# Patient Record
Sex: Female | Born: 1990 | Race: White | Hispanic: No | Marital: Single | Smoking: Former smoker
Health system: Southern US, Community
[De-identification: ages and names within clinical notes are randomized; demographics above are authoritative.]

## PROBLEM LIST (undated history)

## (undated) DIAGNOSIS — O139 Gestational [pregnancy-induced] hypertension without significant proteinuria, unspecified trimester: Secondary | ICD-10-CM

## (undated) DIAGNOSIS — Z789 Other specified health status: Secondary | ICD-10-CM

## (undated) HISTORY — PX: NO PAST SURGERIES: SHX2092

---
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
## 2009-11-16 ENCOUNTER — Inpatient Hospital Stay (HOSPITAL_COMMUNITY): Admission: AD | Admit: 2009-11-16 | Discharge: 2009-11-19 | Payer: Self-pay | Admitting: Obstetrics & Gynecology

## 2009-11-17 ENCOUNTER — Encounter (INDEPENDENT_AMBULATORY_CARE_PROVIDER_SITE_OTHER): Payer: Self-pay | Admitting: Obstetrics and Gynecology

## 2010-05-12 LAB — CBC
MCHC: 34.5 g/dL (ref 30.0–36.0)
Platelets: 149 10*3/uL — ABNORMAL LOW (ref 150–400)
Platelets: 181 10*3/uL (ref 150–400)
RDW: 13.7 % (ref 11.5–15.5)
RDW: 13.8 % (ref 11.5–15.5)
WBC: 10.2 10*3/uL (ref 4.0–10.5)

## 2010-05-12 LAB — RPR: RPR Ser Ql: NONREACTIVE

## 2011-07-05 ENCOUNTER — Emergency Department (HOSPITAL_COMMUNITY)
Admission: EM | Admit: 2011-07-05 | Discharge: 2011-07-05 | Disposition: A | Discharge status: Home / Self Care | Payer: Self-pay | Attending: Emergency Medicine | Admitting: Emergency Medicine

## 2011-07-05 ENCOUNTER — Encounter (HOSPITAL_COMMUNITY): Payer: Self-pay | Admitting: Emergency Medicine

## 2011-07-05 DIAGNOSIS — R10816 Epigastric abdominal tenderness: Secondary | ICD-10-CM | POA: Insufficient documentation

## 2011-07-05 DIAGNOSIS — K279 Peptic ulcer, site unspecified, unspecified as acute or chronic, without hemorrhage or perforation: Secondary | ICD-10-CM | POA: Insufficient documentation

## 2011-07-05 LAB — URINALYSIS, ROUTINE W REFLEX MICROSCOPIC
Bilirubin Urine: NEGATIVE
Nitrite: NEGATIVE
Specific Gravity, Urine: 1.01 (ref 1.005–1.030)
Urobilinogen, UA: 0.2 mg/dL (ref 0.0–1.0)

## 2011-07-05 LAB — COMPREHENSIVE METABOLIC PANEL
Albumin: 3.7 g/dL (ref 3.5–5.2)
Alkaline Phosphatase: 44 U/L (ref 39–117)
BUN: 8 mg/dL (ref 6–23)
Potassium: 4.2 mEq/L (ref 3.5–5.1)
Total Protein: 6.7 g/dL (ref 6.0–8.3)

## 2011-07-05 LAB — CBC
MCV: 97 fL (ref 78.0–100.0)
Platelets: 174 10*3/uL (ref 150–400)
RDW: 12.7 % (ref 11.5–15.5)
WBC: 7.6 10*3/uL (ref 4.0–10.5)

## 2011-07-05 LAB — OCCULT BLOOD, POC DEVICE: Fecal Occult Bld: NEGATIVE

## 2011-07-05 LAB — PREGNANCY, URINE: Preg Test, Ur: NEGATIVE

## 2011-07-05 MED ORDER — LANSOPRAZOLE 15 MG PO CPDR: 15.0000 mg | DELAYED_RELEASE_CAPSULE | Freq: Every day | ORAL | Status: DC

## 2011-07-05 MED ORDER — HYDROCODONE-ACETAMINOPHEN 5-325 MG PO TABS: 1.0000 | ORAL_TABLET | Freq: Four times a day (QID) | ORAL | Status: AC | PRN

## 2011-07-05 MED ORDER — ONDANSETRON HCL 4 MG PO TABS: 4.0000 mg | ORAL_TABLET | Freq: Four times a day (QID) | ORAL | Status: AC

## 2011-07-05 MED ORDER — GI COCKTAIL ~~LOC~~
30.0000 mL | Freq: Once | ORAL | Status: AC
  Administered 2011-07-05: 30 mL via ORAL
  Filled 2011-07-05: qty 30

## 2011-07-05 NOTE — ED Provider Notes (Signed)
Medical screening examination/treatment/procedure(s) were performed by non-physician practitioner and as supervising physician I was immediately available for consultation/collaboration.  Doug Sou, MD 07/05/11 [phone]

## 2011-07-05 NOTE — ED Notes (Signed)
Epigastric pain started Saturday, with nausea. Had 2 neg pregnancy tests. No vomiting, but having diarrhea- black coffee ground looking per patient

## 2011-07-05 NOTE — ED Notes (Signed)
AVW:[id number]> Expected date:<BR> Expected time:<BR> Means of arrival:<BR> Comments:<BR> Perrot

## 2011-07-05 NOTE — Discharge Instructions (Signed)
Peptic Ulcers Ulcers are small, open craters or sores that develop in the lining of the stomach or the duodenum (the first part of the small intestine). The term peptic ulcer is used to describe both types of ulcers. There are a number of treatments that relieve the discomfort of ulcers. In most cases ulcers do heal.  CAUSES AND COMMON FEATURES OF PEPTIC ULCERS  Peptic ulcers occur only in areas of the digestive system that come in contact with digestive juices. These juices are secreted (given off) by the stomach. They include acid and an enzyme called pepsin that breaks down proteins. Many people with duodenal ulcers have too much digestive juice spilling down from the stomach. Most people with gastric (stomach) ulcers have normal or below normal amounts of stomach acid. Sometimes, when the mucous membrane (protective lining) of the stomach and duodenum does not protect well, this may add to the growth of ulcers. Duodenal ulcers often produces pain in a small area between the breastbone and navel. Pain varies from hunger pain to constant gnawing or burning sensations (feeling). Sometimes the pain is felt during sleep and may awaken the person in the middle of the night. Often the pain occurs two or three hours after eating, when the stomach is empty. Other common symptoms (problems) include overeating for pain relief. Eating relieves the pain of a duodenal ulcer. Gastric ulcer pain may be felt in the same place as the pain of a duodenal ulcer, or slightly higher up. There may also be sensations of feeling full, indigestion, and heartburn. Sometimes pain occurs when the stomach is full. This causes loss of appetite followed by weight loss. HOME CARE INSTRUCTIONS   Use of tobacco products have been found to slow down the healing of an ulcer. STOP SMOKING.   Avoid alcohol, aspirin, and other inflammation (swelling and soreness) reducing drugs. These substances weaken the stomach lining.   Eat regular,  nutritious meals.   Avoid foods that bother you.   Take medications and antacids as directed. Over-the-counter medications are used to neutralize stomach acid. Prescription medications reduce acid secretion, block acid production, or provide a protective coating over the ulcer. If a specific antacid was prescribed, do not switch brands without your caregiver's approval.  Surgery is usually not necessary. Diet and/or drug therapy usually is effective. Surgery may be necessary if perforation, obstruction due to scarring, or uncontrollable bleeding is found, or if severe pain is not otherwise controlled. SEEK IMMEDIATE MEDICAL CARE IF:  You see signs of bleeding. This includes vomiting fresh, bright red blood or passing bloody or tarry, black stools.   You suffer weakness, fatigue, or loss of consciousness. These symptoms can result from severe hemorrhaging (bleeding). Shock may result.   You have sudden, intense, severe abdominal (belly) pain. This is the first sign of a perforation. This would require immediate surgical treatment.   You have intense pain and continued vomiting. This could signal an obstruction of the digestive tract.  Document Released: 02/11/2000 Document Revised: 02/02/2011 Document Reviewed: 02/10/2008 ExitCare Patient Information 2012 [employer]. 

## 2011-07-05 NOTE — ED Provider Notes (Signed)
History     CSN: [id number]  Arrival date & time 07/05/11  4098   First MD Initiated Contact with Patient 07/05/11 1010      Chief Complaint  Patient presents with  . Abdominal Pain  . Gastrophageal Reflux    (Consider location/radiation/quality/duration/timing/severity/associated sxs/prior treatment) HPI  Patient presents to the ED with complaints of epigastric abdominal pain. The patient states that the pains last about 30 seconds and are "sharp and twisting pains" that happened every 3-4 minutes. Eating does not make it better or worse. She has had some nausea associated with this but no vomiting or diarrhea. Today she had 1 bowel movement which she described as coffee ground consistency and color. The pains started on Saturday. Todays symptoms are a little bit better than yesterday. She denies hx or family hx of pancreatitis, gall bladder dz, or gastric ulcers. She denies having gas but admits to having a history of GERD. pts symptoms are moderate. PT declines pain medication at this time.  History reviewed. No pertinent past medical history.  History reviewed. No pertinent past surgical history.  History reviewed. No pertinent family history.  History  Substance Use Topics  . Smoking status: Current Some Day Smoker -- 0.5 packs/day  . Smokeless tobacco: Not on file  . Alcohol Use: No    OB History    Grav Para Term Preterm Abortions TAB SAB Ect Mult Living                  Review of Systems   HEENT: denies blurry vision or change in hearing PULMONARY: Denies difficulty breathing and SOB CARDIAC: denies chest pain or heart palpitations MUSCULOSKELETAL:  denies being unable to ambulate, denies back pain  GU: denies loss of bowel or urinary control NEURO: denies numbness and tingling in extremities   Allergies  Review of patient's allergies indicates not on file.  Home Medications   Current Outpatient Rx  Name Route Sig Dispense Refill  . ADULT MULTIVITAMIN  W/MINERALS CH Oral Take 1 tablet by mouth at bedtime.    Marland Kitchen SIMETHICONE 125 MG PO CAPS Oral Take 2 capsules by mouth daily as needed. For gas pain.    Marland Kitchen HYDROCODONE-ACETAMINOPHEN 5-325 MG PO TABS Oral Take 1 tablet by mouth every 6 (six) hours as needed for pain. 8 tablet 0  . LANSOPRAZOLE 15 MG PO CPDR Oral Take 1 capsule (15 mg total) by mouth daily. 30 capsule 0  . ONDANSETRON HCL 4 MG PO TABS Oral Take 1 tablet (4 mg total) by mouth every 6 (six) hours. 12 tablet 0    BP 124/76  Pulse 94  Temp(Src) 98.8 F (37.1 C) (Oral)  Resp 16  SpO2 96%  Physical Exam  Nursing note and vitals reviewed. Constitutional: She appears well-developed and well-nourished. No distress.  HENT:  Head: Normocephalic and atraumatic.  Eyes: Pupils are equal, round, and reactive to light.  Neck: Normal range of motion. Neck supple.  Cardiovascular: Normal rate and regular rhythm.   Pulmonary/Chest: Effort normal.  Abdominal: Soft. Bowel sounds are normal. She exhibits no distension and no mass. There is tenderness (epigastric pain). There is no rebound and no guarding.  Neurological: She is alert.  Skin: Skin is warm and dry.    ED Course  Procedures (including critical care time)   Labs Reviewed  URINALYSIS, ROUTINE W REFLEX MICROSCOPIC  PREGNANCY, URINE  CBC  LIPASE, BLOOD  COMPREHENSIVE METABOLIC PANEL  OCCULT BLOOD, POC DEVICE   No results found.  1. Peptic ulcer       MDM  Pts labs are negative for dz concerning the gallbladder, pancreas or liver. Hemoccult negative. The patients symptoms are consistent with stomach ulcer. The patient has been very stressed out recently, admits to using ibuprofen and not eating healthy foods. I am not concerned about non typical chest pain. The patient is healthy, good BMI, exercises daily and denies family history of cardiac dz and hypertension. Pt does admits to being a smoker of 4 cigarettes per day  I have re-evaluated her abdominal pain and she  is currently symptoms free without any treatment in the ED.   I am going to treat the patient for peptic ulcer and start her on Prevacid. I will give the patient a referral to a GI doctor for further evaluation.  Pt given pt education on how to treat pain and avoid worsening of ulcers.  Pt has been advised of the symptoms that warrant their return to the ED. Patient has voiced understanding and has agreed to follow-up with the PCP or specialist.        Dorthula Matas, PA 07/05/11 1213

## 2013-08-28 ENCOUNTER — Other Ambulatory Visit (HOSPITAL_COMMUNITY): Payer: Self-pay | Admitting: *Deleted

## 2013-08-28 DIAGNOSIS — N6310 Unspecified lump in the right breast, unspecified quadrant: Secondary | ICD-10-CM

## 2013-08-28 DIAGNOSIS — N6320 Unspecified lump in the left breast, unspecified quadrant: Secondary | ICD-10-CM

## 2013-09-02 ENCOUNTER — Ambulatory Visit (HOSPITAL_COMMUNITY)
Admission: RE | Admit: 2013-09-02 | Discharge: 2013-09-02 | Disposition: A | Discharge status: Home / Self Care | Payer: Self-pay | Source: Ambulatory Visit | Attending: Obstetrics and Gynecology | Admitting: Obstetrics and Gynecology

## 2013-09-02 ENCOUNTER — Encounter (HOSPITAL_COMMUNITY): Payer: Self-pay

## 2013-09-02 VITALS — BP 108/72 | Temp 98.6°F | Ht 63.0 in | Wt 134.4 lb

## 2013-09-02 DIAGNOSIS — N6320 Unspecified lump in the left breast, unspecified quadrant: Secondary | ICD-10-CM

## 2013-09-02 DIAGNOSIS — Z1239 Encounter for other screening for malignant neoplasm of breast: Secondary | ICD-10-CM

## 2013-09-02 DIAGNOSIS — N6311 Unspecified lump in the right breast, upper outer quadrant: Secondary | ICD-10-CM | POA: Insufficient documentation

## 2013-09-02 DIAGNOSIS — N6325 Unspecified lump in the left breast, overlapping quadrants: Secondary | ICD-10-CM | POA: Insufficient documentation

## 2013-09-02 NOTE — Patient Instructions (Signed)
Taught Cynthia Coffey how to perform BSE and gave educational materials to take home. Patient did not need a Pap smear today due to last Pap smear was 01/09/2013. Let her know B[facility]CP will cover Pap smears every 3 years unless has a history of abnormal Pap smears. [facility]. Appointment scheduled for Thursday, July 9, [address]45 at 1015. Patient aware of appointment and will be there. Cynthia JuryMaranda D Acri verbalized understanding.  [place], Kathaleen Maserhristine Poll, RN 12:40 PM

## 2013-09-02 NOTE — Progress Notes (Signed)
Complaints of bilateral breast lumps. Patient [facility].   Pap Smear:  Pap smear not completed today. [facility] and normal. Per patient has no history of an abnormal Pap smear. Last Pap smear is scanned into EPIC under media.  Physical exam: Breasts Breasts symmetrical. No skin abnormalities bilateral breasts. No nipple retraction bilateral breasts. No nipple discharge bilateral breasts. No lymphadenopathy. Palpated a moveable pea sized lump within the left breast at 3 o'clock 2 cm from the nipple. Palpated a moveable pea sized lump within the right breast at 10 o'clock 4 cm from the nipple. Patient complained of tenderness when palpated the lump within the left breast. [facility] for bilateral breast ultrasound. Appointment scheduled for Thursday, July 9, [address]45 at 1015.  Pelvic/Bimanual No Pap smear completed today since last Pap smear was 01/09/2013. Pap smear not indicated per BCCCP guidelines.

## 2013-09-03 ENCOUNTER — Encounter (HOSPITAL_COMMUNITY): Payer: Self-pay

## 2013-09-04 ENCOUNTER — Other Ambulatory Visit: Payer: Self-pay

## 2013-09-10 ENCOUNTER — Ambulatory Visit
Admission: RE | Admit: 2013-09-10 | Discharge: 2013-09-10 | Disposition: A | Discharge status: Home / Self Care | Payer: No Typology Code available for payment source | Source: Ambulatory Visit | Attending: Obstetrics and Gynecology | Admitting: Obstetrics and Gynecology

## 2013-09-10 DIAGNOSIS — N6320 Unspecified lump in the left breast, unspecified quadrant: Secondary | ICD-10-CM

## 2013-09-10 DIAGNOSIS — N6310 Unspecified lump in the right breast, unspecified quadrant: Secondary | ICD-10-CM

## 2013-12-29 ENCOUNTER — Encounter (HOSPITAL_COMMUNITY): Payer: Self-pay

## 2016-04-23 ENCOUNTER — Encounter: Payer: Self-pay | Admitting: Emergency Medicine

## 2016-04-23 ENCOUNTER — Emergency Department
Admission: EM | Admit: 2016-04-23 | Discharge: 2016-04-23 | Disposition: A | Discharge status: Home / Self Care | Payer: Self-pay | Attending: Emergency Medicine | Admitting: Emergency Medicine

## 2016-04-23 DIAGNOSIS — F17200 Nicotine dependence, unspecified, uncomplicated: Secondary | ICD-10-CM | POA: Insufficient documentation

## 2016-04-23 DIAGNOSIS — K029 Dental caries, unspecified: Secondary | ICD-10-CM | POA: Insufficient documentation

## 2016-04-23 DIAGNOSIS — Z79899 Other long term (current) drug therapy: Secondary | ICD-10-CM | POA: Insufficient documentation

## 2016-04-23 MED ORDER — LIDOCAINE VISCOUS 2 % MT SOLN: OROMUCOSAL | 0 refills | Status: DC

## 2016-04-23 MED ORDER — IBUPROFEN 600 MG PO TABS: 600.0000 mg | ORAL_TABLET | Freq: Three times a day (TID) | ORAL | 0 refills | Status: DC | PRN

## 2016-04-23 MED ORDER — PENICILLIN V POTASSIUM 500 MG PO TABS: 500.0000 mg | ORAL_TABLET | Freq: Four times a day (QID) | ORAL | 0 refills | Status: DC

## 2016-04-23 NOTE — ED Triage Notes (Signed)
Pt c/o R sided lower jaw pain, worsening over the last 3 days. Pt is alert and oriented, stated she didn't have insurance at one time. Respirations even and unlabored, skin, warm, dry, and intact.

## 2016-04-23 NOTE — Discharge Instructions (Signed)
Begin taking antibiotics as directed. A discount card is provided for use for the antibiotic. This card is good for [employer]. Take ibuprofen with food. Use viscous lidocaine to a cotton ball applied to your tooth. Follow-up with one of the dental clinics listed on your discharge papers. These clinics are based on a sliding scale. The dental clinics at [facility] also takes walk-ins.    OPTIONS FOR DENTAL FOLLOW UP CARE  [place] Department of Health and Human Services - Local Safety Net Dental Clinics [email or web address]   [facility] [phone])  [facility]arl Ma [phone])  [place] [phone] ext [phone])  [facility] [phone])  [facility] [phone]) This clinic caters to the indigent population and is on a lottery system. Location: [employer] [facility], [employer], [address] [place] Clinic Hours: Wednesdays from 6pm - 9pm, patients seen by a lottery system. For dates, call or go to [email or web address] Services: Cleanings, fillings and simple extractions. Payment Options: DENTAL WORK IS FREE OF CHARGE. Bring proof of income or support. Best way to get seen: Arrive at 5:15 pm - this is a lottery, NOT first come/first serve, so arriving earlier will not increase your chances of being seen.     [facility] [phone] Select option 1 for emergencies   Location: [facility], [place], [address] [place] Clinic Hours: No walk-ins accepted - call the day before to schedule an appointment. Check in times are 9:30 am and 1:30 pm. Services: Simple extractions, temporary fillings, pulpectomy/pulp debridement, uncomplicated abscess drainage. Payment Options: PAYMENT IS DUE AT THE TIME OF SERVICE.  Fee is usually $100-200, additional surgical procedures (e.g. abscess drainage)  may be extra. Cash, checks, Visa/MasterCard accepted.  Can file Medicaid if patient is covered for dental - patient should call case worker to check. No discount for [facility] patients. Best way to get seen: MUST call the day before and get onto the schedule. Can usually be seen the next 1-2 days. No walk-ins accepted.     [facility] [phone]   Location: [facility], [address], [place] Clinic Hours: M, W, Th, F 8am or 1:30pm, Tues 9a or 1:30 - first come/first served. Services: Simple extractions, temporary fillings, uncomplicated abscess drainage.  You do not need to be an [facility] resident. Payment Options: PAYMENT IS DUE AT THE TIME OF SERVICE. Dental insurance, otherwise sliding scale - bring proof of income or support. Depending on income and treatment needed, cost is usually $50-200. Best way to get seen: Arrive early as it is first come/first served.     [facility] [phone]   Location: [address] Clinic Hours: Mon-Thu 8a-5p Services: Most basic dental services including extractions and fillings. Payment Options: PAYMENT IS DUE AT THE TIME OF SERVICE. Sliding scale, up to 50% off - bring proof if income or support. Medicaid with dental option accepted. Best way to get seen: Call to schedule an appointment, can usually be seen within 2 weeks OR they will try to see walk-ins - show up at 8a or 2p (you may have to wait).     [facility] [phone] [place] RESIDENTS ONLY   Location: [facility], [address] [place], [place] [address] Clinic Hours: By appointment only. Monday - Thursday 8am-5pm, Friday 8am-12pm Services: Cleanings, fillings, extractions. Payment Options: PAYMENT IS DUE AT THE TIME OF SERVICE.  Cash, Visa or MasterCard. Sliding scale - $30 minimum per service. Best way to get seen: Come in to  office, complete packet and make an appointment - need proof of income or support monies for each household member and proof of [facility] residence. Usually takes about a month to get in.     [facility] [phone]   Location: [address] [facility] Hours: Walk-in Urgent Care Dental Services are offered Monday-Friday mornings only. The numbers of emergencies accepted daily is limited to the number of providers available. Maximum 15 - Mondays, Wednesdays & Thursdays Maximum 10 - Tuesdays & Fridays Services: You do not need to be a [facility] [place] [place] resident to be seen for a dental emergency. Emergencies are defined as pain, swelling, abnormal bleeding, or dental trauma. Walkins will receive x-rays if needed. NOTE: Dental cleaning is not an emergency. Payment Options: PAYMENT IS DUE AT THE TIME OF SERVICE. Minimum co-pay is $40.00 for uninsured patients. Minimum co-pay is $3.00 for Medicaid with dental coverage. Dental Insurance is accepted and must be presented at time of visit. Medicare does not cover dental. Forms of payment: Cash, credit card, checks. Best way to get seen: If not previously registered with the clinic, walk-in dental registration begins at 7:15 am and is on a first come/first serve basis. If previously registered with the clinic, call to make an appointment.     [facility] [phone] [place] RESIDENTS ONLY   Location: [address], [place], [place] Clinic Hours: Mon-Thu 10a-2p Services: Extractions only! Payment Options: FREE (donations accepted) - bring proof of income or support Best way to get seen: Call and schedule an appointment OR come at 8am on the 1st Monday of every month (except for holidays) when it is first come/first served.     [facility] [phone]   Location: [address], [place] Clinic Hours: Friday mornings Services, Payment Options, Best way to get  seen: Call for info

## 2016-04-23 NOTE — ED Provider Notes (Signed)
[facility] Emergency Department Provider Note   ____________________________________________   First MD Initiated Contact with Patient 04/23/16 1710     (approximate)  I have reviewed the triage vital signs and the nursing notes.   HISTORY  Chief Complaint Dental Pain    HPI [name] is a 26 y.o. female is here complaining of dental pain for the last 3 days. Patient denies any fever or chills. She has been taking over-the-counter medication, Tylenol, ibuprofen and Orajel without any relief. She also complains of some pain radiating to her right ear. She states that currently she does not have any insurance and cannot go to a dentist.She rates her pain as a 9 out of 10.   History reviewed. No pertinent past medical history.  Patient Active Problem List   Diagnosis Date Noted  . Breast lump on right side at 10 o'clock position 09/02/2013  . Breast lump on left side at 3 o'clock position 09/02/2013    History reviewed. No pertinent surgical history.  Prior to Admission medications   Medication Sig Start Date End Date Taking? Authorizing Provider  ibuprofen (ADVIL,MOTRIN) 600 MG tablet Take 1 tablet (600 mg total) by mouth every 8 (eight) hours as needed. 04/23/16   Tommi Rumpshonda L Summers, PA-C  lansoprazole (PREVACID) 15 MG capsule Take 1 capsule (15 mg total) by mouth daily. 07/05/11 07/04/12  Marlon Peliffany Greene, PA-C  lidocaine (XYLOCAINE) 2 % solution Apply to cotton ball and apply to tooth prn pain 04/23/16   Tommi Rumpshonda L Summers, PA-C  Multiple Vitamin (MULITIVITAMIN WITH MINERALS) TABS Take 1 tablet by mouth at bedtime.    Historical Provider, MD  penicillin v potassium (VEETID) 500 MG tablet Take 1 tablet (500 mg total) by mouth 4 (four) times daily. 04/23/16   Tommi Rumpshonda L Summers, PA-C  Simethicone (GAS-X EXTRA STRENGTH) 125 MG CAPS Take 2 capsules by mouth daily as needed. For gas pain.    Historical Provider, MD    Allergies Patient has no known  allergies.  Family History  Problem Relation Age of Onset  . Diabetes Paternal Grandfather     Social History Social History  Substance Use Topics  . Smoking status: Current Every Day Smoker    Packs/day: 0.25  . Smokeless tobacco: Not on file  . Alcohol use No    Review of Systems Constitutional: No fever/chills Eyes: No visual changes. ENT: No sore throat.  Positive dental pain. Cardiovascular: Denies chest pain. Respiratory: Denies shortness of breath. Gastrointestinal:   No nausea, no vomiting.   Neurological: Negative for headaches  10-point ROS otherwise negative.  ____________________________________________   PHYSICAL EXAM:  VITAL SIGNS: ED Triage Vitals  Enc Vitals Group     BP      Pulse      Resp      Temp      Temp src      SpO2      Weight      Height      Head Circumference      Peak Flow      Pain Score      Pain Loc      Pain Edu?      Excl. in GC?     Constitutional: Alert and oriented. Well appearing and in no acute distress. Eyes: Conjunctivae are normal. PERRL. EOMI. Head: Atraumatic. Nose: No congestion/rhinnorhea.  EACs and TMs are clear bilaterally. Mouth/Throat: Mucous membranes are moist.  Oropharynx non-erythematous. Large dental cavity noted right lower molar. #  30. No obvious signs of abscess, drainage, erythema. There is some tenderness on palpation with a tongue depressor. Neck: No stridor.   Hematological/Lymphatic/Immunilogical: No cervical lymphadenopathy. Cardiovascular: Normal rate, regular rhythm. Grossly normal heart sounds.  Good peripheral circulation. Respiratory: Normal respiratory effort.  No retractions. Lungs CTAB. Musculoskeletal: Moves upper and lower extremities without difficulty.  Normal gait.   Neurologic:  Normal speech and language. No gross focal neurologic deficits are appreciated. No gait instability. Skin:  Skin is warm, dry and intact. No rash noted. Psychiatric: Mood and affect are normal. Speech  and behavior are normal.  ____________________________________________   LABS (all labs ordered are listed, but only abnormal results are displayed)  Labs Reviewed - No data to display ____________________________________________   PROCEDURES  Procedure(s) performed: None  Procedures  Critical Care performed: No  ____________________________________________   INITIAL IMPRESSION / ASSESSMENT AND PLAN / ED COURSE  Pertinent labs & imaging results that were available during my care of the patient were reviewed by me and considered in my medical decision making (see chart for details).   Patient is given a prescription for Pen-Vee K 500 mg 4 times a day for 7 days. She is also given a prescription for ibuprofen 600 mg 3 times a day along with viscous lidocaine. She was given a list of dental clinics to follow up with and encouraged to get in touch with Bernestine [facility]ass who also takes walk-in patients.   ____________________________________________   FINAL CLINICAL IMPRESSION(S) / ED DIAGNOSES  Final diagnoses:  Pain due to dental caries      NEW MEDICATIONS STARTED DURING THIS VISIT:  New Prescriptions   IBUPROFEN (ADVIL,MOTRIN) 600 MG TABLET    Take 1 tablet (600 mg total) by mouth every 8 (eight) hours as needed.   LIDOCAINE (XYLOCAINE) 2 % SOLUTION    Apply to cotton ball and apply to tooth prn pain   PENICILLIN V POTASSIUM (VEETID) 500 MG TABLET    Take 1 tablet (500 mg total) by mouth 4 (four) times daily.     Note:  This document was prepared using Dragon voice recognition software and may include unintentional dictation errors.    Tommi Rumps, PA-C 04/23/16 1747    Sharyn Creamer, MD 04/23/16 2116

## 2016-04-23 NOTE — ED Notes (Signed)
See this RN's triage note, also c/o R sided ear pain at this time.

## 2016-08-10 ENCOUNTER — Encounter: Payer: Self-pay | Admitting: *Deleted

## 2016-08-10 ENCOUNTER — Emergency Department
Admission: EM | Admit: 2016-08-10 | Discharge: 2016-08-10 | Disposition: A | Discharge status: Home / Self Care | Payer: Self-pay | Attending: Emergency Medicine | Admitting: Emergency Medicine

## 2016-08-10 DIAGNOSIS — R413 Other amnesia: Secondary | ICD-10-CM | POA: Insufficient documentation

## 2016-08-10 DIAGNOSIS — Z5321 Procedure and treatment not carried out due to patient leaving prior to being seen by health care provider: Secondary | ICD-10-CM | POA: Insufficient documentation

## 2016-08-10 LAB — URINE DRUG SCREEN, QUALITATIVE (ARMC ONLY)
AMPHETAMINES, UR SCREEN: POSITIVE — AB
BARBITURATES, UR SCREEN: NOT DETECTED
BENZODIAZEPINE, UR SCRN: NOT DETECTED
Cannabinoid 50 Ng, Ur ~~LOC~~: POSITIVE — AB
Cocaine Metabolite,Ur ~~LOC~~: NOT DETECTED
MDMA (Ecstasy)Ur Screen: NOT DETECTED
METHADONE SCREEN, URINE: NOT DETECTED
Opiate, Ur Screen: NOT DETECTED
Phencyclidine (PCP) Ur S: NOT DETECTED
TRICYCLIC, UR SCREEN: NOT DETECTED

## 2016-08-10 LAB — CBC
HEMATOCRIT: 39.3 % (ref 35.0–47.0)
HEMOGLOBIN: 13.7 g/dL (ref 12.0–16.0)
MCH: 34.9 pg — AB (ref 26.0–34.0)
MCHC: 34.8 g/dL (ref 32.0–36.0)
MCV: 100.2 fL — ABNORMAL HIGH (ref 80.0–100.0)
Platelets: 183 10*3/uL (ref 150–440)
RBC: 3.92 MIL/uL (ref 3.80–5.20)
RDW: 12.8 % (ref 11.5–14.5)
WBC: 9.6 10*3/uL (ref 3.6–11.0)

## 2016-08-10 LAB — BASIC METABOLIC PANEL
ANION GAP: 5 (ref 5–15)
BUN: 9 mg/dL (ref 6–20)
CALCIUM: 9.2 mg/dL (ref 8.9–10.3)
CHLORIDE: 109 mmol/L (ref 101–111)
CO2: 24 mmol/L (ref 22–32)
Creatinine, Ser: 0.47 mg/dL (ref 0.44–1.00)
GFR calc non Af Amer: >60 mL/min (ref >60)
Glucose, Bld: 121 mg/dL — ABNORMAL HIGH (ref 65–99)
POTASSIUM: 3.3 mmol/L — AB (ref 3.5–5.1)
Sodium: 138 mmol/L (ref 135–145)

## 2016-08-10 LAB — POCT PREGNANCY, URINE: PREG TEST UR: NEGATIVE

## 2016-08-10 LAB — URINALYSIS, COMPLETE (UACMP) WITH MICROSCOPIC
BACTERIA UA: NONE SEEN
BILIRUBIN URINE: NEGATIVE
Glucose, UA: NEGATIVE mg/dL
KETONES UR: NEGATIVE mg/dL
LEUKOCYTES UA: NEGATIVE
Nitrite: NEGATIVE
PH: 5 (ref 5.0–8.0)
Protein, ur: NEGATIVE mg/dL
SPECIFIC GRAVITY, URINE: 1.019 (ref 1.005–1.030)

## 2016-08-10 NOTE — ED Triage Notes (Signed)
Pt ambulatory to triage.  Pt reports memory loss 6 days ago.  Boyfriend reports pt hasn't been remembering things recently.  Pt denies headache.  Pt denies etoh use and drug use.  Pt alert.  Speech clear.  Ambulates without diff.
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
## 2016-08-17 ENCOUNTER — Ambulatory Visit
Admission: EM | Admit: 2016-08-17 | Discharge: 2016-08-17 | Disposition: A | Discharge status: Home / Self Care | Payer: Self-pay | Attending: Family Medicine | Admitting: Family Medicine

## 2016-08-17 ENCOUNTER — Encounter: Payer: Self-pay | Admitting: *Deleted

## 2016-08-17 DIAGNOSIS — N760 Acute vaginitis: Secondary | ICD-10-CM

## 2016-08-17 DIAGNOSIS — N939 Abnormal uterine and vaginal bleeding, unspecified: Secondary | ICD-10-CM

## 2016-08-17 DIAGNOSIS — B9689 Other specified bacterial agents as the cause of diseases classified elsewhere: Secondary | ICD-10-CM

## 2016-08-17 LAB — URINALYSIS, COMPLETE (UACMP) WITH MICROSCOPIC
Glucose, UA: NEGATIVE mg/dL
Ketones, ur: 40 mg/dL — AB
Leukocytes, UA: NEGATIVE
Nitrite: NEGATIVE
Protein, ur: 30 mg/dL — AB
Specific Gravity, Urine: >1.03 — ABNORMAL HIGH (ref 1.005–1.030)
pH: 5.5 (ref 5.0–8.0)

## 2016-08-17 LAB — CBC WITH DIFFERENTIAL/PLATELET
Basophils Absolute: 0 10*3/uL (ref 0–0.1)
Basophils Relative: 0 %
Eosinophils Absolute: 0 10*3/uL (ref 0–0.7)
Eosinophils Relative: 1 %
HCT: 41.5 % (ref 35.0–47.0)
Hemoglobin: 14 g/dL (ref 12.0–16.0)
Lymphocytes Relative: 24 %
Lymphs Abs: 2 10*3/uL (ref 1.0–3.6)
MCH: 33.8 pg (ref 26.0–34.0)
MCHC: 33.8 g/dL (ref 32.0–36.0)
MCV: 100.2 fL — ABNORMAL HIGH (ref 80.0–100.0)
Monocytes Absolute: 0.4 10*3/uL (ref 0.2–0.9)
Monocytes Relative: 5 %
Neutro Abs: 5.9 10*3/uL (ref 1.4–6.5)
Neutrophils Relative %: 70 %
Platelets: 199 10*3/uL (ref 150–440)
RBC: 4.14 MIL/uL (ref 3.80–5.20)
RDW: 13.1 % (ref 11.5–14.5)
WBC: 8.4 10*3/uL (ref 3.6–11.0)

## 2016-08-17 LAB — CHLAMYDIA/NGC RT PCR (ARMC ONLY)
Chlamydia Tr: NOT DETECTED
N gonorrhoeae: NOT DETECTED

## 2016-08-17 LAB — WET PREP, GENITAL
Sperm: NONE SEEN
Trich, Wet Prep: NONE SEEN
WBC, Wet Prep HPF POC: NONE SEEN
Yeast Wet Prep HPF POC: NONE SEEN

## 2016-08-17 LAB — PREGNANCY, URINE: Preg Test, Ur: NEGATIVE

## 2016-08-17 MED ORDER — METRONIDAZOLE 500 MG PO TABS: 500.0000 mg | ORAL_TABLET | Freq: Two times a day (BID) | ORAL | 0 refills | Status: DC

## 2016-08-17 NOTE — Discharge Instructions (Signed)
[facility] [address] [place], [place] [address] [phone]

## 2016-08-17 NOTE — ED Triage Notes (Signed)
Patient started having symptom of vaginal bleeding on June 8 which she thought was her period. Yesterday severe vaginal bleed returned with abdominal pain. Patient fear she may have a STD.

## 2016-08-17 NOTE — ED Provider Notes (Signed)
CSN: [id number]     Arrival date & time 08/17/16  1424 History   First MD Initiated Contact with Patient 08/17/16 1543     Chief Complaint  Patient presents with  . Vaginal Bleeding  . Abdominal Pain   (Consider location/radiation/quality/duration/timing/severity/associated sxs/prior Treatment) HPI  26 year old female who presents with vaginal bleeding abdominal pain concerns of STD exposure. She started vaginal bleeding on June 8 which was early for her normal periods. Much heavier than normal and lasted for 7 days. 2 days ago she began bleeding again heavily and continues today. She states that she was using more tampons than usual changing one every couple of hours where she would normally only used 2-3 a day. He states that she caught her boyfriend cheating and therefore wanted to be checked for any STDs.        History reviewed. No pertinent past medical history. History reviewed. No pertinent surgical history. Family History  Problem Relation Age of Onset  . Diabetes Paternal Grandfather    Social History  Substance Use Topics  . Smoking status: Current Every Day Smoker    Packs/day: 0.25  . Smokeless tobacco: Never Used  . Alcohol use No   OB History    Gravida Para Term Preterm AB Living   2 2 2     2    SAB TAB Ectopic Multiple Live Births                 Review of Systems  Constitutional: Negative for activity change, appetite change, chills, fatigue and fever.  Genitourinary: Positive for menstrual problem, pelvic pain, vaginal bleeding and vaginal discharge.  All other systems reviewed and are negative.   Allergies  Patient has no known allergies.  Home Medications   Prior to Admission medications   Medication Sig Start Date End Date Taking? Authorizing Provider  ibuprofen (ADVIL,MOTRIN) 600 MG tablet Take 1 tablet (600 mg total) by mouth every 8 (eight) hours as needed. 04/23/16   Tommi Rumps, PA-C  lansoprazole (PREVACID) 15 MG capsule Take 1  capsule (15 mg total) by mouth daily. 07/05/11 07/04/12  Marlon Pel, PA-C  lidocaine (XYLOCAINE) 2 % solution Apply to cotton ball and apply to tooth prn pain 04/23/16   Bridget Hartshorn L, PA-C  metroNIDAZOLE (FLAGYL) 500 MG tablet Take 1 tablet (500 mg total) by mouth 2 (two) times daily. 08/17/16   Lutricia Feil, PA-C  Multiple Vitamin (MULITIVITAMIN WITH MINERALS) TABS Take 1 tablet by mouth at bedtime.    [provider]  penicillin v potassium (VEETID) 500 MG tablet Take 1 tablet (500 mg total) by mouth 4 (four) times daily. 04/23/16   Tommi Rumps, PA-C  Simethicone (GAS-X EXTRA STRENGTH) 125 MG CAPS Take 2 capsules by mouth daily as needed. For gas pain.    [provider]   Meds Ordered and Administered this Visit  Medications - No data to display  BP 111/74 (BP Location: Left Arm)   Pulse 70   Temp 98.6 F (37 C) (Oral)   Resp 16   LMP 08/10/2016   SpO2 99%  No data found.   Physical Exam  Constitutional: She appears well-developed and well-nourished. No distress.  HENT:  Head: Normocephalic.  Eyes: Pupils are equal, round, and reactive to light.  Neck: Normal range of motion.  Pulmonary/Chest: Effort normal and breath sounds normal.  Abdominal: Soft. Bowel sounds are normal. She exhibits no distension and no mass. There is no rebound and no guarding.  Genitourinary: Uterus normal. Vaginal discharge found.  Genitourinary Comments: Pelvic exam was performed with Marcelino DusterMichelle, CMA, as chaperone and assisted. External genitalia is normal. No blood is seen at the introitus. There is no discharge at the introitus. Speculum exam was then performed with the finding of bright red blood in the fornix along with some darker blood. There is no abnormalities of the vaginal walls. Samples were obtained for GC chlamydia and wet prep. Speculum was then removed. BiManual exam performed showing no adnexal tenderness or fullness and no cervical motion tenderness present.  Uterus appeared firm.  Skin: She is not diaphoretic.  Nursing note and vitals reviewed.   Urgent Care Course     Procedures (including critical care time)  Labs Review Labs Reviewed  WET PREP, GENITAL - Abnormal; Notable for the following:       Result Value   Clue Cells Wet Prep HPF POC PRESENT (*)    All other components within normal limits  CBC WITH DIFFERENTIAL/PLATELET - Abnormal; Notable for the following:    MCV 100.2 (*)    All other components within normal limits  URINALYSIS, COMPLETE (UACMP) WITH MICROSCOPIC - Abnormal; Notable for the following:    APPearance CLOUDY (*)    Specific Gravity, Urine >1.030 (*)    Hgb urine dipstick TRACE (*)    Bilirubin Urine MODERATE (*)    Ketones, ur 40 (*)    Protein, ur 30 (*)    Squamous Epithelial / LPF 6-30 (*)    Bacteria, UA FEW (*)    All other components within normal limits  CHLAMYDIA/NGC RT PCR ([facility] ONLY)  PREGNANCY, URINE  RPR  HIV ANTIBODY (ROUTINE TESTING)    Imaging Review No results found.   Visual Acuity Review  Right Eye Distance:   Left Eye Distance:   Bilateral Distance:    Right Eye Near:   Left Eye Near:    Bilateral Near:         MDM   1. BV (bacterial vaginosis)   2. Abnormal vaginal bleeding    Discharge Medication List as of 08/17/2016  4:43 PM    START taking these medications   Details  metroNIDAZOLE (FLAGYL) 500 MG tablet Take 1 tablet (500 mg total) by mouth 2 (two) times daily., Starting Thu 08/17/2016, Normal      Plan: 1. Test/x-ray results and diagnosis reviewed with patient 2. rx as per orders; risks, benefits, potential side effects reviewed with patient 3. Recommend supportive treatment with Refrain from sex for 7 days while on the Flagyl. She is also told not to drink alcohol. Her Chlamydia /GC will be available tomorrow and the RPR HIV tests later on in the week. She thinks that maybe her blood vaginal bleeding has been slowing down somewhat. She does not  wish to have Megace for vaginal bleeding at this time. If she continues to have problems I have told her to [facility]. Address and phone number was provided to the patient. 4. F/u prn if symptoms worsen or don't improve     Lutricia FeilRoemer, Virdia Ziesmer P, PA-C 08/17/16 1659

## 2016-08-18 LAB — HIV ANTIBODY (ROUTINE TESTING W REFLEX): HIV Screen 4th Generation wRfx: NONREACTIVE

## 2016-08-18 LAB — RPR: RPR Ser Ql: NONREACTIVE

## 2016-08-23 ENCOUNTER — Encounter: Payer: Self-pay | Admitting: Medical Oncology

## 2016-08-23 ENCOUNTER — Emergency Department
Admission: EM | Admit: 2016-08-23 | Discharge: 2016-08-23 | Disposition: A | Discharge status: Home / Self Care | Payer: Self-pay | Attending: Emergency Medicine | Admitting: Emergency Medicine

## 2016-08-23 ENCOUNTER — Ambulatory Visit: Admission: EM | Admit: 2016-08-23 | Discharge: 2016-08-23 | Discharge status: Absent without leave | Payer: Self-pay

## 2016-08-23 ENCOUNTER — Emergency Department: Payer: Self-pay

## 2016-08-23 DIAGNOSIS — Z79899 Other long term (current) drug therapy: Secondary | ICD-10-CM | POA: Insufficient documentation

## 2016-08-23 DIAGNOSIS — F17210 Nicotine dependence, cigarettes, uncomplicated: Secondary | ICD-10-CM | POA: Insufficient documentation

## 2016-08-23 DIAGNOSIS — N939 Abnormal uterine and vaginal bleeding, unspecified: Secondary | ICD-10-CM | POA: Insufficient documentation

## 2016-08-23 LAB — WET PREP, GENITAL
CLUE CELLS WET PREP: NONE SEEN
Sperm: NONE SEEN
TRICH WET PREP: NONE SEEN
YEAST WET PREP: NONE SEEN

## 2016-08-23 LAB — URINALYSIS, ROUTINE W REFLEX MICROSCOPIC
BACTERIA UA: NONE SEEN
BILIRUBIN URINE: NEGATIVE
Glucose, UA: NEGATIVE mg/dL
KETONES UR: 5 mg/dL — AB
Nitrite: NEGATIVE
PH: 5 (ref 5.0–8.0)
PROTEIN: NEGATIVE mg/dL
Specific Gravity, Urine: 1.02 (ref 1.005–1.030)

## 2016-08-23 LAB — POCT PREGNANCY, URINE: PREG TEST UR: NEGATIVE

## 2016-08-23 LAB — CHLAMYDIA/NGC RT PCR (ARMC ONLY)
Chlamydia Tr: NOT DETECTED
N gonorrhoeae: NOT DETECTED

## 2016-08-23 MED ORDER — LEVONORG-ETH ESTRAD TRIPHASIC PO TABS: 1.0000 | ORAL_TABLET | Freq: Every day | ORAL | 0 refills | Status: DC

## 2016-08-23 NOTE — ED Notes (Signed)
Pt c/o vaginal bleeding since 6/9, describes blood as heavy with clots, pain radiating into lower abd and back. Pt also wants to have STD check. Pt Denies fever, n/v//d

## 2016-08-23 NOTE — ED Provider Notes (Signed)
[facility] Emergency Department Provider Note ____________________________________________  Time seen: 1811  I have reviewed the triage vital signs and the nursing notes.  HISTORY  Chief Complaint  Vaginal Bleeding and Abdominal Cramping  HPI Cynthia Coffey is a 26 y.o. female visits to the ED for evaluation of continued abnormal vaginal bleeding. [facility] week [facility] for same complaint. She apparently told to return to the clinic today but left due to the protracted way. She presents here for evaluation of continued vaginal bleeding since June 8. She describes dark brown blood that has been persistent since her. Initially stopped. She also reports some vaginal irritation. She denies any nausea, vomiting, dizziness, fevers, chills. She had [facility]D [facility] which is available at this time. She was started on metronidazole for confirmed BV infection.She denies a history of uterine fibroids, PCOS, ovarian cysts, or irregular menses.  History reviewed. No pertinent past medical history.  Patient Active Problem List   Diagnosis Date Noted  . Breast lump on right side at 10 o'clock position 09/02/2013  . Breast lump on left side at 3 o'clock position 09/02/2013    History reviewed. No pertinent surgical history.  Prior to Admission medications   Medication Sig Start Date End Date Taking? Authorizing Provider  ibuprofen (ADVIL,MOTRIN) 600 MG tablet Take 1 tablet (600 mg total) by mouth every 8 (eight) hours as needed. 04/23/16   Tommi RumpsSummers, Rhonda L, PA-C  lansoprazole (PREVACID) 15 MG capsule Take 1 capsule (15 mg total) by mouth daily. 07/05/11 07/04/12  Marlon PelGreene, Tiffany, PA-C  levonorgestrel-ethinyl estradiol (ENPRESSE,TRIVORA) tablet Take 1 tablet by mouth daily. 08/23/16   Lakia Gritton, Charlesetta IvoryJenise V Bacon, PA-C  lidocaine (XYLOCAINE) 2 % solution Apply to cotton ball and apply to tooth prn pain 04/23/16    Bridget HartshornSummers, Rhonda L, PA-C  metroNIDAZOLE (FLAGYL) 500 MG tablet Take 1 tablet (500 mg total) by mouth 2 (two) times daily. 08/17/16   Lutricia Feiloemer, William P, PA-C  Multiple Vitamin (MULITIVITAMIN WITH MINERALS) TABS Take 1 tablet by mouth at bedtime.    [provider]  penicillin v potassium (VEETID) 500 MG tablet Take 1 tablet (500 mg total) by mouth 4 (four) times daily. 04/23/16   Tommi RumpsSummers, Rhonda L, PA-C  Simethicone (GAS-X EXTRA STRENGTH) 125 MG CAPS Take 2 capsules by mouth daily as needed. For gas pain.    [provider]    Allergies Patient has no known allergies.  Family History  Problem Relation Age of Onset  . Diabetes Paternal Grandfather     Social History Social History  Substance Use Topics  . Smoking status: Current Every Day Smoker    Packs/day: 0.25  . Smokeless tobacco: Never Used  . Alcohol use No    Review of Systems  Constitutional: Negative for fever. Gastrointestinal: Negative for abdominal pain, vomiting and diarrhea. Genitourinary: Negative for dysuria. Abnormal vaginal bleeding as above. Musculoskeletal: Negative for back pain. Skin: Negative for rash. Neurological: Negative for headaches, focal weakness or numbness. ____________________________________________  PHYSICAL EXAM:  VITAL SIGNS: ED Triage Vitals  Enc Vitals Group     BP 08/23/16 1748 (!) 142/92     Pulse Rate 08/23/16 1748 (!) 110     Resp 08/23/16 1748 18     Temp 08/23/16 1748 98.5 F (36.9 C)     Temp Source 08/23/16 1748 Oral     SpO2 08/23/16 1748 100 %     Weight 08/23/16 1749 140 lb (  63.5 kg)     Height 08/23/16 1749 5\' 5"  (1.651 m)     Head Circumference --      Peak Flow --      Pain Score 08/23/16 1748 6     Pain Loc --      Pain Edu? --      Excl. in GC? --    Constitutional: Alert and oriented. Well appearing and in no distress. Head: Normocephalic and atraumatic. Cardiovascular: Normal rate, regular rhythm. Normal distal pulses. Respiratory: Normal  respiratory effort. No wheezes/rales/rhonchi. GU: Normal external genitalia. Dark blood noted in the cul de sac. No adnexal masses or CMT  Musculoskeletal: Nontender with normal range of motion in all extremities.  Neurologic:  Normal gait without ataxia. Normal speech and language. No gross focal neurologic deficits are appreciated. Skin:  Skin is warm, dry and intact. No rash noted. Psychiatric: Mood is anxious and affect is normal. Patient exhibits appropriate insight and judgment. ____________________________________________   LABS (pertinent positives/negatives)  Labs Reviewed  WET PREP, GENITAL - Abnormal; Notable for the following:       Result Value   WBC, Wet Prep HPF POC FEW (*)    All other components within normal limits  URINALYSIS, ROUTINE W REFLEX MICROSCOPIC - Abnormal; Notable for the following:    Color, Urine YELLOW (*)    APPearance CLEAR (*)    Hgb urine dipstick MODERATE (*)    Ketones, ur 5 (*)    Leukocytes, UA TRACE (*)    Squamous Epithelial / LPF 0-5 (*)    All other components within normal limits  CHLAMYDIA/NGC RT PCR ([facility] ONLY)  POCT PREGNANCY, URINE  POC URINE PREG, ED  ____________________________________________   RADIOLOGY  Pelvic/Transvaginal US  IMPRESSION: Unremarkable pelvic ultrasound. Normal endometrial stripe measuring 7 mm. ____________________________________________  INITIAL IMPRESSION / ASSESSMENT AND PLAN / ED COURSE  He female patient with a three-week complaint of abnormal uterine bleeding with negative STD testing both 1 week prior and today. Her wet prep is also cleared this time. Her urine shows no signs of any acute cystitis. Her ultrasound is negative for any acute abdominal findings. She is advised this time that she likely has a component to her abnormal uterine bleeding. She is discharged with a low dose oral contraceptive pill to help regular menses. She is referred to [facility] for further evaluation. Return  precautions are reviewed. The patient verbalized understanding of her lab and ultrasound test results at this time. ____________________________________________  FINAL CLINICAL IMPRESSION(S) / ED DIAGNOSES  Final diagnoses:  Abnormal uterine bleeding      Berlinda Farve, Charlesetta Ivory, PA-C 08/23/16 2230    Loleta Rose, MD 08/23/16 2337

## 2016-08-23 NOTE — Discharge Instructions (Signed)
Your labs, exam, and ultrasound were normal today. Take the medicine as directed. Follow-up with OBG as discussed.

## 2016-08-23 NOTE — ED Triage Notes (Signed)
Pt reports that she began having vaginal bleeding on 6/8, since then she has been having bleeding off and on. Pt reports some abdominal cramping but thinks she may have STD. Came to ed 6 days ago but was not seen.

## 2016-08-23 NOTE — ED Notes (Signed)
Patient did not want her vitals retaken. Patient states her family is cussing her out and saying "you are lying about being at the hospital." patient d/c.

## 2016-10-28 ENCOUNTER — Encounter: Payer: Self-pay | Admitting: Emergency Medicine

## 2016-10-28 DIAGNOSIS — Z3A01 Less than 8 weeks gestation of pregnancy: Secondary | ICD-10-CM | POA: Insufficient documentation

## 2016-10-28 DIAGNOSIS — O2340 Unspecified infection of urinary tract in pregnancy, unspecified trimester: Secondary | ICD-10-CM | POA: Insufficient documentation

## 2016-10-28 DIAGNOSIS — O9989 Other specified diseases and conditions complicating pregnancy, childbirth and the puerperium: Secondary | ICD-10-CM | POA: Insufficient documentation

## 2016-10-28 DIAGNOSIS — Z5321 Procedure and treatment not carried out due to patient leaving prior to being seen by health care provider: Secondary | ICD-10-CM | POA: Diagnosis not present

## 2016-10-28 DIAGNOSIS — R300 Dysuria: Secondary | ICD-10-CM | POA: Diagnosis not present

## 2016-10-28 DIAGNOSIS — R1030 Lower abdominal pain, unspecified: Secondary | ICD-10-CM | POA: Insufficient documentation

## 2016-10-28 LAB — COMPREHENSIVE METABOLIC PANEL
ALT: 22 U/L (ref 14–54)
ANION GAP: 5 (ref 5–15)
AST: 21 U/L (ref 15–41)
Albumin: 4 g/dL (ref 3.5–5.0)
Alkaline Phosphatase: 39 U/L (ref 38–126)
BUN: 15 mg/dL (ref 6–20)
CALCIUM: 9.4 mg/dL (ref 8.9–10.3)
CHLORIDE: 106 mmol/L (ref 101–111)
CO2: 27 mmol/L (ref 22–32)
Creatinine, Ser: 0.55 mg/dL (ref 0.44–1.00)
GFR calc non Af Amer: >60 mL/min (ref >60)
Glucose, Bld: 81 mg/dL (ref 65–99)
Potassium: 4.1 mmol/L (ref 3.5–5.1)
SODIUM: 138 mmol/L (ref 135–145)
Total Bilirubin: 0.4 mg/dL (ref 0.3–1.2)
Total Protein: 6.8 g/dL (ref 6.5–8.1)

## 2016-10-28 LAB — URINALYSIS, COMPLETE (UACMP) WITH MICROSCOPIC
Bacteria, UA: NONE SEEN
Bilirubin Urine: NEGATIVE
GLUCOSE, UA: NEGATIVE mg/dL
Hgb urine dipstick: NEGATIVE
KETONES UR: NEGATIVE mg/dL
Leukocytes, UA: NEGATIVE
Nitrite: NEGATIVE
PROTEIN: NEGATIVE mg/dL
Specific Gravity, Urine: 1.016 (ref 1.005–1.030)
pH: 5 (ref 5.0–8.0)

## 2016-10-28 LAB — CBC
HCT: 38.5 % (ref 35.0–47.0)
HEMOGLOBIN: 13.3 g/dL (ref 12.0–16.0)
MCH: 34 pg (ref 26.0–34.0)
MCHC: 34.4 g/dL (ref 32.0–36.0)
MCV: 98.6 fL (ref 80.0–100.0)
Platelets: 214 10*3/uL (ref 150–440)
RBC: 3.9 MIL/uL (ref 3.80–5.20)
RDW: 13.3 % (ref 11.5–14.5)
WBC: 8.6 10*3/uL (ref 3.6–11.0)

## 2016-10-28 LAB — POCT PREGNANCY, URINE: Preg Test, Ur: POSITIVE — AB

## 2016-10-28 LAB — LIPASE, BLOOD: LIPASE: 29 U/L (ref 11–51)

## 2016-10-28 NOTE — ED Notes (Signed)
Patient ambulatory to stat desk without difficulty or distress.  Reports painful urination and lower abdominal pain.  Reports just found out she is pregnant.  By pregnancy wheel and lmp patient is approximately [redacted] weeks pregnant.

## 2016-10-28 NOTE — ED Triage Notes (Addendum)
Pt reports vaginal discomfort since this am as well as upper abdominal pain; 2 home pregnancy tests were positive this afternoon; tonight after showering she noticed burning when she voided; pt says she has not had a menstrual cycle since sometime late July; pt concerned about STD's as well, has been sexually active with her current boyfriend and her ex-boyfriend; denies vaginal discharge;

## 2016-10-29 ENCOUNTER — Emergency Department
Admission: EM | Admit: 2016-10-29 | Discharge: 2016-10-29 | Disposition: A | Discharge status: Home / Self Care | Payer: Medicaid Other | Attending: Emergency Medicine | Admitting: Emergency Medicine

## 2016-10-29 NOTE — ED Notes (Signed)
No answer when called for treatment room.  Unable to locate patient in lobby.

## 2016-10-31 ENCOUNTER — Telehealth: Payer: Self-pay | Admitting: Emergency Medicine

## 2016-10-31 NOTE — Telephone Encounter (Addendum)
Called patient due to lwot to inquire about condition and follow up plans. No answer and no voicemail.  9/5--called again.  No answer and no voicemail. Will send letter to assure she knows of positive pregnancy and to follow up.

## 2016-12-19 ENCOUNTER — Encounter: Payer: Self-pay | Admitting: Obstetrics and Gynecology

## 2016-12-19 ENCOUNTER — Other Ambulatory Visit: Payer: Self-pay | Admitting: Obstetrics and Gynecology

## 2016-12-19 ENCOUNTER — Ambulatory Visit (INDEPENDENT_AMBULATORY_CARE_PROVIDER_SITE_OTHER): Payer: Medicaid Other | Admitting: Obstetrics and Gynecology

## 2016-12-19 VITALS — BP 114/70 | Wt 148.0 lb

## 2016-12-19 DIAGNOSIS — Z3A12 12 weeks gestation of pregnancy: Secondary | ICD-10-CM

## 2016-12-19 DIAGNOSIS — O0970 Supervision of high risk pregnancy due to social problems, unspecified trimester: Secondary | ICD-10-CM | POA: Insufficient documentation

## 2016-12-19 DIAGNOSIS — Z3491 Encounter for supervision of normal pregnancy, unspecified, first trimester: Secondary | ICD-10-CM

## 2016-12-19 DIAGNOSIS — O0972 Supervision of high risk pregnancy due to social problems, second trimester: Secondary | ICD-10-CM | POA: Insufficient documentation

## 2016-12-19 DIAGNOSIS — O0993 Supervision of high risk pregnancy, unspecified, third trimester: Secondary | ICD-10-CM | POA: Insufficient documentation

## 2016-12-19 DIAGNOSIS — O0992 Supervision of high risk pregnancy, unspecified, second trimester: Secondary | ICD-10-CM | POA: Insufficient documentation

## 2016-12-19 NOTE — Progress Notes (Signed)
New Obstetric Patient H&P   Chief Complaint: "Desires prenatal care"   History of Present Illness: Patient is a 26 y.o. [id number] Not Hispanic or Latino female, LMP 09/20/16 presents with amenorrhea and positive home pregnancy test. Based on her  LMP, her EDD is Estimated Date of Delivery: 06/27/17 and her EGA is 5839w6d. Cycles are 6. days, regular, and occur approximately every : 28 days. Unsure when last pap smear was performed.  Since her LMP she claims she has experienced no issues. She denies vaginal bleeding. Her past medical history is noncontributory. Her prior pregnancies are notable for no complications  Since her LMP, she admits to the use of tobacco products  yes She claims she has gained   4 pounds since the start of her pregnancy.  There are cats in the home in the home  no  She admits close contact with children on a regular basis  yes  She has had chicken pox in the past yes She has had Tuberculosis exposures, symptoms, or previously tested positive for TB   no Current or past history of domestic violence. no  Genetic Screening/Teratology Counseling: (Includes patient, baby's father, or anyone in either family with:)   1. Patient's age [facility]/= 6735 [facility]  no 2. Thalassemia ([place], [place], Mediterranean, or Asian background): MCV<80  no 3. Neural tube defect (meningomyelocele, spina bifida, anencephaly)  no 4. Congenital heart defect  no  5. Down syndrome  no 6. Tay-Sachs (Jewish, [place])  no 7. Canavan's Disease  no 8. Sickle cell disease or trait (African)  no  9. Hemophilia or other blood disorders  no  10. Muscular dystrophy  no  11. Cystic fibrosis  no  12. Huntington's Chorea  no  13. Mental retardation/autism  no 14. Other inherited genetic or chromosomal disorder  no 15. Maternal metabolic disorder (DM, PKU, etc)  no 16. Patient or FOB with a child with a birth defect not listed above no  16a. Patient or FOB with a birth defect themselves no 17. Recurrent  pregnancy loss, or stillbirth  no  18. Any medications since LMP other than prenatal vitamins (include vitamins, supplements, OTC meds, drugs, alcohol)  no 19. Any other genetic/environmental exposure to discuss  no  Infection History:   1. Lives with someone with TB or TB exposed  no  2. Patient or partner has history of genital herpes  no 3. Rash or viral illness since LMP  no 4. History of STI (GC, CT, HPV, syphilis, HIV)  no 5. History of recent travel :  no  Other pertinent information:  no   Review of Systems:10 point review of systems negative unless otherwise noted in HPI  Past Medical History: Denies  Past Surgical History: Denies  Gynecologic History: Patient's last menstrual period was 09/20/2016 (approximate).  Obstetric History: [id number], s/p SVD x 2, uncomplicated  Family History  Problem Relation Age of Onset  . Diabetes Paternal Grandfather   . Colon cancer Mother   . Throat cancer Paternal Grandmother     Social History   Social History  . Marital status: Single    Spouse name: N/A  . Number of children: N/A  . Years of education: N/A   Occupational History  . Not on file.   Social History Main Topics  . Smoking status: Current Some Day Smoker    Packs/day: 0.25    Types: Cigarettes  . Smokeless tobacco: Never Used  . Alcohol use No  . Drug use: No  .  Sexual activity: Yes    Birth control/ protection: None   Other Topics Concern  . Not on file   Social History Narrative  . No narrative on file   Allergies: No Known Allergies  Prior to Admission medications   Medication Sig Start Date End Date Taking? Authorizing Provider  Prenatal Vit-Fe Fumarate-FA (PRENATAL VITAMIN PO) Take by mouth.   Yes [provider]    Physical Exam BP 114/70   Wt 148 lb (67.1 kg)   LMP 09/20/2016 (Approximate)   BMI 25.81 kg/m   Physical Exam  Constitutional: She is oriented to person, place, and time. She appears well-developed and  well-nourished. No distress.  HENT:  Head: Normocephalic and atraumatic.  Eyes: Conjunctivae are normal.  Neck: Normal range of motion. Neck supple. No thyromegaly present.  Cardiovascular: Normal rate, regular rhythm and normal heart sounds.  Exam reveals no gallop and no friction rub.   No murmur heard. Pulmonary/Chest: Effort normal and breath sounds normal. She has no wheezes.  Abdominal: Soft. She exhibits no distension. There is no tenderness. There is no rebound and no guarding. No hernia. Hernia confirmed negative in the right inguinal area and confirmed negative in the left inguinal area.  Uterus about 12-13 weeks size  Genitourinary: Vagina normal. Pelvic exam was performed with patient supine. There is no rash, tenderness or lesion on the right labia. There is no rash, tenderness or lesion on the left labia. Cervix exhibits no motion tenderness and no discharge. Right adnexum displays no mass and no tenderness. Left adnexum displays no mass and no tenderness. No erythema or bleeding in the vagina. No signs of injury around the vagina.  Genitourinary Comments: Uterus enlarged to 12-13 weeks, non-tender +FHT at 145 bpm  Musculoskeletal: Normal range of motion.  Lymphadenopathy:    She has no cervical adenopathy.       Right: No inguinal adenopathy present.       Left: No inguinal adenopathy present.  Neurological: She is alert and oriented to person, place, and time.  Skin: Skin is warm and dry. No rash noted.  Psychiatric: She has a normal mood and affect. Her behavior is normal. Judgment normal.     Female Chaperone present during breast and/or pelvic exam.   Assessment: 26 y.o. G3P2002 at [redacted]w[redacted]d presenting to initiate prenatal care  Plan: 1) Avoid alcoholic beverages. 2) Patient encouraged not to smoke.  3) Discontinue the use of all non-medicinal drugs and chemicals.  4) Take prenatal vitamins daily.  5) Nutrition, food safety (fish, cheese advisories, and high nitrite  foods) and exercise discussed. 6) Hospital and practice style discussed with cross coverage system.  7) Genetic Screening, such as with 1st Trimester Screening, cell free fetal DNA, AFP testing, and Ultrasound, as well as with amniocentesis and CVS as appropriate, is discussed with patient. At the conclusion of today's visit patient requested genetic testing 8) Patient is asked about travel to areas at risk for the Zika virus, and counseled to avoid travel and exposure to mosquitoes or sexual partners who may have themselves been exposed to the virus. Testing is discussed, and will be ordered as appropriate.   Thomasene Mohair, MD 12/19/2016 2:32 PM

## 2016-12-20 ENCOUNTER — Ambulatory Visit: Payer: Medicaid Other

## 2016-12-20 ENCOUNTER — Ambulatory Visit (INDEPENDENT_AMBULATORY_CARE_PROVIDER_SITE_OTHER): Payer: Medicaid Other | Admitting: Obstetrics and Gynecology

## 2016-12-20 VITALS — BP 116/74 | Wt 148.0 lb

## 2016-12-20 DIAGNOSIS — Z3491 Encounter for supervision of normal pregnancy, unspecified, first trimester: Secondary | ICD-10-CM | POA: Diagnosis not present

## 2016-12-20 DIAGNOSIS — Z3A12 12 weeks gestation of pregnancy: Secondary | ICD-10-CM

## 2016-12-20 LAB — RPR+RH+ABO+RUB AB+AB SCR+CB...
ANTIBODY SCREEN: NEGATIVE
HEMATOCRIT: 36.6 % (ref 34.0–46.6)
HIV Screen 4th Generation wRfx: NONREACTIVE
Hemoglobin: 12.3 g/dL (ref 11.1–15.9)
Hepatitis B Surface Ag: NEGATIVE
MCH: 33 pg (ref 26.6–33.0)
MCHC: 33.6 g/dL (ref 31.5–35.7)
MCV: 98 fL — AB (ref 79–97)
Platelets: 182 10*3/uL (ref 150–379)
RBC: 3.73 x10E6/uL — ABNORMAL LOW (ref 3.77–5.28)
RDW: 14 % (ref 12.3–15.4)
RH TYPE: POSITIVE
RPR: NONREACTIVE
Rubella Antibodies, IGG: 4.21 index (ref >0.99)
Varicella zoster IgG: 320 index (ref >165)
WBC: 7.8 10*3/uL (ref 3.4–10.8)

## 2016-12-20 NOTE — Progress Notes (Signed)
  Routine Prenatal Care Visit  Subjective  Cynthia Coffey is a 26 y.o. G3P2002 at 6557w4d being seen today for ongoing prenatal care.  She is currently monitored for the following issues for this low-risk pregnancy and has Breast lump on right side at 10 o'clock position; Breast lump on left side at 3 o'clock position; and Supervision of low-risk pregnancy on her problem list.  ----------------------------------------------------------------------------------- Patient reports no complaints.   Contractions: Not present. Vag. Bleeding: None.   . Denies leaking of fluid.  Transabdominal u/s performed by me today. CRL: 6.11 cm = 12 w 4 d GA (averaged over three measurements) Single living intrauterine pregnancy Placenta fundal Ovaries/adnexae: not visualized FHR: present at 142 bpm Fetal activity noted. Cervix: appears closed Gestational age adjusted due to uncertain LMP ----------------------------------------------------------------------------------- The following portions of the patient's history were reviewed and updated as appropriate: allergies, current medications, past family history, past medical history, past social history, past surgical history and problem list. Problem list updated.   Objective  Blood pressure 116/74, weight 148 lb (67.1 kg), last menstrual period 09/20/2016. Pregravid weight 142 lb (64.4 kg) Total Weight Gain 6 lb (2.722 kg) Urinalysis: Urine Protein: Negative Urine Glucose: Negative  Fetal Status: Fetal Heart Rate (bpm): present         General:  Alert, oriented and cooperative. Patient is in no acute distress.  Skin: Skin is warm and dry. No rash noted.   Cardiovascular: Normal heart rate noted  Respiratory: Normal respiratory effort, no problems with respiration noted  Abdomen: Soft, gravid, appropriate for gestational age. Pain/Pressure: Absent     Pelvic:  Cervical exam deferred        Extremities: Normal range of motion.     Mental Status:  Normal mood and affect. Normal behavior. Normal judgment and thought content.   Assessment   26 y.o. [id number] at 6557w4d by  06/30/2017, by Ultrasound presenting for routine prenatal visit  Plan   pregnancy Problems (from 12/19/16 to present)    Problem Noted Resolved   Supervision of low-risk pregnancy 12/19/2016 by Conard NovakJackson, Stephen D, MD No   Overview Signed 12/20/2016  9:28 AM by Conard NovakJackson, Stephen D, MD    [facility] Prenatal Labs  Dating 12 week u/s Blood type: A/Positive/-- (10/23 1523)   Genetic Screen 1 Screen: [ ]  desires   AFP:   NIPS: Antibody:Negative (10/23 1523)  Anatomic US  Rubella: 4.21 (10/23 1523) Varicella: @VZVIGG @  GTT Third trimester:  RPR: Non Reactive (10/23 1523)   Rhogam  HBsAg: Negative (10/23 1523)   TDaP vaccine                       Flu Shot: HIV:   Negative  Baby Food                                GBS:   Contraception  Pap:           Please refer to After Visit Summary for other counseling recommendations.   Return in about 1 week (around 12/27/2016) for schedule u/s for NT and routine prenatal.  Thomasene MohairStephen Jackson, MD  12/20/2016 9:22 AM

## 2016-12-25 ENCOUNTER — Ambulatory Visit (INDEPENDENT_AMBULATORY_CARE_PROVIDER_SITE_OTHER): Payer: Medicaid Other | Admitting: Obstetrics and Gynecology

## 2016-12-25 ENCOUNTER — Ambulatory Visit (INDEPENDENT_AMBULATORY_CARE_PROVIDER_SITE_OTHER): Payer: Medicaid Other

## 2016-12-25 ENCOUNTER — Other Ambulatory Visit: Payer: Self-pay | Admitting: Obstetrics and Gynecology

## 2016-12-25 ENCOUNTER — Encounter: Payer: Self-pay | Admitting: Obstetrics and Gynecology

## 2016-12-25 VITALS — BP 118/74 | Wt 149.0 lb

## 2016-12-25 DIAGNOSIS — Z362 Encounter for other antenatal screening follow-up: Secondary | ICD-10-CM | POA: Diagnosis not present

## 2016-12-25 DIAGNOSIS — Z3491 Encounter for supervision of normal pregnancy, unspecified, first trimester: Secondary | ICD-10-CM

## 2016-12-25 DIAGNOSIS — Z3A13 13 weeks gestation of pregnancy: Secondary | ICD-10-CM

## 2016-12-25 NOTE — Progress Notes (Signed)
  Routine Prenatal Care Visit  Subjective  [name] is a 26 y.o. G3P2002 at 3581w2d being seen today for ongoing prenatal care.  She is currently monitored for the following issues for this low-risk pregnancy and has Breast lump on right side at 10 o'clock position; Breast lump on left side at 3 o'clock position; and Supervision of low-risk pregnancy on her problem list.  ----------------------------------------------------------------------------------- Patient reports no complaints.   Contractions: Not present. Vag. Bleeding: None.   . Denies leaking of fluid.  NT screen today. NT 1.244mm max.  ----------------------------------------------------------------------------------- The following portions of the patient's history were reviewed and updated as appropriate: allergies, current medications, past family history, past medical history, past social history, past surgical history and problem list. Problem list updated.  Objective  Blood pressure 118/74, weight 149 lb (67.6 kg), last menstrual period 09/20/2016. Pregravid weight 142 lb (64.4 kg) Total Weight Gain 7 lb (3.175 kg) Urinalysis: Urine Protein: Negative Urine Glucose: Negative  Fetal Status: Fetal Heart Rate (bpm): Present         General:  Alert, oriented and cooperative. Patient is in no acute distress.  Skin: Skin is warm and dry. No rash noted.   Cardiovascular: Normal heart rate noted  Respiratory: Normal respiratory effort, no problems with respiration noted  Abdomen: Soft, gravid, appropriate for gestational age. Pain/Pressure: Absent     Pelvic:  Cervical exam deferred        Extremities: Normal range of motion.     Mental Status: Normal mood and affect. Normal behavior. Normal judgment and thought content.   Assessment   26 y.o. G3P2002 at 3981w2d by  06/30/2017, by Ultrasound presenting for routine prenatal visit  Plan   pregnancy Problems (from 12/19/16 to present)    Problem Noted Resolved   Supervision  of low-risk pregnancy 12/19/2016 by Conard NovakJackson, Reaghan Kawa D, MD No   Overview Addendum 12/22/2016 10:55 AM by Conard NovakJackson, Waylon Koffler D, MD    Clinic [facility] Prenatal Labs  Dating 12 week u/s Blood type: A/Positive/-- (10/23 1523)   Genetic Screen 1 Screen: [ ]  desires   AFP:   NIPS: Antibody:Negative (10/23 1523)  Anatomic US  Rubella: 4.21 (10/23 1523)  VaricellaL Immune  GTT Third trimester:  RPR: Non Reactive (10/23 1523)   Rhogam n/a HBsAg: Negative (10/23 1523)   TDaP vaccine                       Flu Shot: HIV:   Negative  Baby Food                                GBS:   Contraception  Pap:  CBB     Support Person            Please refer to After Visit Summary for other counseling recommendations.   Return in about 4 weeks (around 01/22/2017) for Routine Prenatal Appointment.  Thomasene MohairStephen Maccoy Haubner, MD  12/25/2016 1:30 PM

## 2016-12-27 LAB — FIRST TRIMESTER SCREEN W/NT
CRL: 70.3 mm
DIA MoM: 1.11
DIA VALUE: 251.9 pg/mL
Gest Age-Collect: 13 weeks
HCG MOM: 1.4
Maternal Age At EDD: 28.6 yr
NUMBER OF FETUSES: 1
Nuchal Translucency MoM: 0.81
Nuchal Translucency: 1.4 mm
PAPP-A MOM: 0.65
PAPP-A VALUE: 736.5 ng/mL
Test Results:: NEGATIVE
Weight: 149 [lb_av]
hCG Value: 113.8 IU/mL

## 2016-12-27 LAB — IGP, CTNG, RFX APTIMA HPV ASCU
CHLAMYDIA, NUC. ACID AMP: NEGATIVE
GONOCOCCUS BY NUCLEIC ACID AMP: NEGATIVE
PAP Smear Comment: 0

## 2016-12-27 LAB — HPV APTIMA: HPV Aptima: POSITIVE — AB

## 2016-12-28 ENCOUNTER — Telehealth: Payer: Self-pay | Admitting: Obstetrics and Gynecology

## 2016-12-28 NOTE — Telephone Encounter (Signed)
Left generic vm to call me back regarding test results.

## 2016-12-28 NOTE — Telephone Encounter (Signed)
Pt is calling back to speak with Dr. Jean RosenthalJackson. Please advise

## 2016-12-29 LAB — INHERITEST SOCIETY GUIDED

## 2016-12-29 NOTE — Telephone Encounter (Signed)
Discussed all results normal except pap smear (ASCUS, HPV +). Recommend colposcopy. She voiced understanding and agreement to call and schedule appt with me for colposcopy.

## 2016-12-29 NOTE — Telephone Encounter (Signed)
Pt is calling wanting to know her labs results. Please advise

## 2017-01-10 ENCOUNTER — Encounter: Payer: Self-pay | Admitting: Obstetrics and Gynecology

## 2017-01-10 ENCOUNTER — Ambulatory Visit (INDEPENDENT_AMBULATORY_CARE_PROVIDER_SITE_OTHER): Payer: Medicaid Other | Admitting: Obstetrics and Gynecology

## 2017-01-10 VITALS — BP 124/56 | HR 97 | Wt 154.0 lb

## 2017-01-10 DIAGNOSIS — R87810 Cervical high risk human papillomavirus (HPV) DNA test positive: Secondary | ICD-10-CM

## 2017-01-10 DIAGNOSIS — R87610 Atypical squamous cells of undetermined significance on cytologic smear of cervix (ASC-US): Secondary | ICD-10-CM | POA: Diagnosis not present

## 2017-01-10 DIAGNOSIS — Z3490 Encounter for supervision of normal pregnancy, unspecified, unspecified trimester: Secondary | ICD-10-CM

## 2017-01-10 NOTE — Progress Notes (Signed)
   GYNECOLOGY CLINIC COLPOSCOPY PROCEDURE NOTE  26 y.o. [id number] here for colposcopy for ASCUS with POSITIVE high risk HPV pap smear on 12/20/2016. Discussed underlying role for HPV infection in the development of cervical dysplasia, its natural history and progression/regression, need for surveillance.  Is the patient  pregnant: Yes LMP: Patient's last menstrual period was 09/20/2016 (approximate). Smoking status:  Metrics: Intervention Frequency ACO  Documented Smoking Status Yearly  Screened one or more times in 24 months  Cessation Counseling or  Active cessation medication Past 24 months  Past 24 months   Guideline developer: UpToDate (See UpToDate for funding source) Date Released: 2014   Contraception: N/A Future fertility desired:  Yes  Patient given informed consent, signed copy in the chart, time out was performed.  The patient was position in dorsal lithotomy position. Speculum was placed the cervix was visualized.   After application of acetic acid colposcopic inspection of the cervix was undertaken.   Colposcopy adequate, full visualization of transformation zone: Yes no visible lesions; no biopsies obtained.   ECC specimen obtained:  No  All specimens were labeled and sent to pathology.   Patient was given post procedure instructions.  Will follow up pathology and manage accordingly.  Routine preventative health maintenance measures emphasized. Follow up pap at 6 week postpartum  Physical Exam  Genitourinary:      Vena AustriaAndreas Bernese Doffing, MD, Merlinda FrederickFACOG [facility], [facility]

## 2017-01-22 ENCOUNTER — Encounter: Payer: Self-pay | Admitting: Advanced Practice Midwife

## 2017-01-22 ENCOUNTER — Ambulatory Visit (INDEPENDENT_AMBULATORY_CARE_PROVIDER_SITE_OTHER): Payer: Medicaid Other | Admitting: Advanced Practice Midwife

## 2017-01-22 VITALS — BP 110/70 | Wt 156.0 lb

## 2017-01-22 DIAGNOSIS — Z3490 Encounter for supervision of normal pregnancy, unspecified, unspecified trimester: Secondary | ICD-10-CM

## 2017-01-22 DIAGNOSIS — Z3A17 17 weeks gestation of pregnancy: Secondary | ICD-10-CM

## 2017-01-22 NOTE — Progress Notes (Signed)
No concerns.rj 

## 2017-01-22 NOTE — Progress Notes (Signed)
  Routine Prenatal Care Visit  Subjective  Cynthia Coffey is a 26 y.o. G3P2002 at 2153w2d being seen today for ongoing prenatal care.  She is currently monitored for the following issues for this low-risk pregnancy and has Breast lump on right side at 10 o'clock position; Breast lump on left side at 3 o'clock position; Supervision of low-risk pregnancy; and ASCUS with positive high risk HPV cervical on their problem list.  ----------------------------------------------------------------------------------- Patient reports no complaints.   Denies Contractions. Denies Vaginal Bleeding. Denies leaking of fluid.  ----------------------------------------------------------------------------------- The following portions of the patient's history were reviewed and updated as appropriate: allergies, current medications, past family history, past medical history, past social history, past surgical history and problem list. Problem list updated.   Objective  Blood pressure 110/70, weight 156 lb (70.8 kg), last menstrual period 09/20/2016. Pregravid weight 142 lb (64.4 kg) Total Weight Gain 14 lb (6.35 kg) Urinalysis: Urine Protein: Negative Urine Glucose: Negative  Fetal Status: Positive fetal heart tones  General:  Alert, oriented and cooperative. Patient is in no acute distress.  Skin: Skin is warm and dry. No rash noted.   Cardiovascular: Normal heart rate noted  Respiratory: Normal respiratory effort, no problems with respiration noted  Abdomen: Soft, gravid, appropriate for gestational age.       Pelvic:  Cervical exam deferred        Extremities: Normal range of motion.     Mental Status: Normal mood and affect. Normal behavior. Normal judgment and thought content.   Assessment   26 y.o. [id number] at 4453w2d by  06/30/2017, by Ultrasound presenting for routine prenatal visit  Plan   pregnancy Problems (from 12/19/16 to present)    Problem Noted Resolved   Supervision of low-risk pregnancy  12/19/2016 by Conard NovakJackson, Stephen D, MD No   Overview Addendum 01/10/2017  3:01 PM by Vena AustriaStaebler, Andreas, MD    Clinic [facility] Prenatal Labs  Dating 12 week u/s Blood type: A/Positive/-- (10/23 1523)   Genetic Screen 1 Screen: negative   AFP:  SMA and fragile X negative Antibody:Negative (10/23 1523)  Anatomic US  Rubella: 4.21 (10/23 1523)  VaricellaL Immune  GTT Third trimester:  RPR: Non Reactive (10/23 1523)   Rhogam n/a HBsAg: Negative (10/23 1523)   TDaP vaccine                       Flu Shot: HIV:   Negative  Baby Food                                GBS:   Contraception  Pap: ASCUS HPV pos 12/20/16  CBB   Colposcopy 01/10/17 no visible lesions follow up pap postpartum  Support Person                Preterm labor symptoms and general obstetric precautions including but not limited to vaginal bleeding, contractions, leaking of fluid and fetal movement were reviewed in detail with the patient.  Return in about 3 weeks (around 02/12/2017) for anatomy scan and rob.  Tresea MallJane Rena Sweeden, CNM  01/22/2017 8:55 AM

## 2017-02-12 ENCOUNTER — Encounter: Payer: Self-pay | Admitting: Obstetrics and Gynecology

## 2017-02-12 ENCOUNTER — Ambulatory Visit (INDEPENDENT_AMBULATORY_CARE_PROVIDER_SITE_OTHER): Payer: Medicaid Other

## 2017-02-12 ENCOUNTER — Ambulatory Visit (INDEPENDENT_AMBULATORY_CARE_PROVIDER_SITE_OTHER): Payer: Medicaid Other | Admitting: Obstetrics and Gynecology

## 2017-02-12 VITALS — BP 114/70 | Wt 162.0 lb

## 2017-02-12 DIAGNOSIS — Z362 Encounter for other antenatal screening follow-up: Secondary | ICD-10-CM

## 2017-02-12 DIAGNOSIS — Z3490 Encounter for supervision of normal pregnancy, unspecified, unspecified trimester: Secondary | ICD-10-CM

## 2017-02-12 DIAGNOSIS — Z3A20 20 weeks gestation of pregnancy: Secondary | ICD-10-CM

## 2017-02-12 DIAGNOSIS — Z3492 Encounter for supervision of normal pregnancy, unspecified, second trimester: Secondary | ICD-10-CM

## 2017-02-12 NOTE — Progress Notes (Signed)
  Routine Prenatal Care Visit Subjective  Cynthia Coffey is a 26 y.o. G3P2002 at 8673w2d being seen today for ongoing prenatal care.  She is currently monitored for the following issues for this low-risk pregnancy and has Breast lump on right side at 10 o'clock position; Breast lump on left side at 3 o'clock position; Supervision of low-risk pregnancy; and ASCUS with positive high risk HPV cervical on their problem list.  ----------------------------------------------------------------------------------- Patient reports no complaints.   Contractions: Not present. Vag. Bleeding: None.  Movement: Present. Denies leaking of fluid.  Anatomy screen complete ----------------------------------------------------------------------------------- The following portions of the patient's history were reviewed and updated as appropriate: allergies, current medications, past family history, past medical history, past social history, past surgical history and problem list. Problem list updated.   Objective  Blood pressure 114/70, weight 162 lb (73.5 kg), last menstrual period 09/20/2016. Pregravid weight 142 lb (64.4 kg) Total Weight Gain 20 lb (9.072 kg) Urinalysis: Urine Protein: Negative Urine Glucose: Negative  Fetal Status: Fetal Heart Rate (bpm): Present   Movement: Present     General:  Alert, oriented and cooperative. Patient is in no acute distress.  Skin: Skin is warm and dry. No rash noted.   Cardiovascular: Normal heart rate noted  Respiratory: Normal respiratory effort, no problems with respiration noted  Abdomen: Soft, gravid, appropriate for gestational age. Pain/Pressure: Absent     Pelvic:  Cervical exam deferred        Extremities: Normal range of motion.     Mental Status: Normal mood and affect. Normal behavior. Normal judgment and thought content.   Assessment   26 y.o. G3P2002 at 5573w2d by  06/30/2017, by Ultrasound presenting for routine prenatal visit  Plan   pregnancy  Problems (from 12/19/16 to present)    Problem Noted Resolved   Supervision of low-risk pregnancy 12/19/2016 by Conard NovakJackson, Stephen D, MD No   Overview Addendum 01/10/2017  3:01 PM by Vena AustriaStaebler, Andreas, MD    [facility]inic [facility] Prenatal Labs  Dating 12 week u/s Blood type: A/Positive/-- (10/23 1523)   Genetic Screen 1 Screen: negative   AFP:  SMA and fragile X negative Antibody:Negative (10/23 1523)  Anatomic US Complete @ 20 wks Rubella: 4.21 (10/23 1523)  VaricellaL Immune  GTT Third trimester:  RPR: Non Reactive (10/23 1523)   Rhogam n/a HBsAg: Negative (10/23 1523)   TDaP vaccine                       Flu Shot: HIV:   Negative  Baby Food                                GBS:   Contraception  Pap: ASCUS HPV pos 12/20/16  CBB   Colposcopy 01/10/17 no visible lesions follow up pap postpartum  Support Person            Preterm labor symptoms and general obstetric precautions including but not limited to vaginal bleeding, contractions, leaking of fluid and fetal movement were reviewed in detail with the patient. Please refer to After Visit Summary for other counseling recommendations.   Return in about 4 weeks (around 03/12/2017) for Routine Prenatal Appointment.  Thomasene MohairStephen Jackson, MD  02/12/2017 10:50 AM

## 2017-03-07 ENCOUNTER — Encounter (HOSPITAL_COMMUNITY): Payer: Self-pay

## 2017-03-12 ENCOUNTER — Encounter: Payer: Self-pay | Admitting: Maternal Newborn

## 2017-03-12 ENCOUNTER — Ambulatory Visit (INDEPENDENT_AMBULATORY_CARE_PROVIDER_SITE_OTHER): Payer: Medicaid Other | Admitting: Maternal Newborn

## 2017-03-12 ENCOUNTER — Encounter: Payer: Medicaid Other | Admitting: Maternal Newborn

## 2017-03-12 VITALS — BP 110/70 | Wt 169.0 lb

## 2017-03-12 DIAGNOSIS — Z3A24 24 weeks gestation of pregnancy: Secondary | ICD-10-CM

## 2017-03-12 DIAGNOSIS — Z3492 Encounter for supervision of normal pregnancy, unspecified, second trimester: Secondary | ICD-10-CM

## 2017-03-12 NOTE — Progress Notes (Signed)
No concerns.rj 

## 2017-03-12 NOTE — Progress Notes (Addendum)
    Routine Prenatal Care Visit  Subjective  Cynthia Coffey is a 27 y.o. G3P2002 at 2361w2d being seen today for ongoing prenatal care.  She is currently monitored for the following issues for this low-risk pregnancy and has Breast lump on right side at 10 o'clock position; Breast lump on left side at 3 o'clock position; Supervision of low-risk pregnancy; and ASCUS with positive high risk HPV cervical on their problem list.  ----------------------------------------------------------------------------------- Patient reports no complaints. She is concerned about the amount of weight that she has gained. Reports difficulty with controlling portions when snacking. Contractions: Not present. Vag. Bleeding: None.  Movement: Present. Denies leaking of fluid.  ----------------------------------------------------------------------------------- The following portions of the patient's history were reviewed and updated as appropriate: allergies, current medications, past family history, past medical history, past social history, past surgical history and problem list. Problem list updated.   Objective  Last menstrual period 09/20/2016. Pregravid weight 142 lb (64.4 kg) Total Weight Gain 27 lb (12.2 kg) Urinalysis: Urine Protein: Negative Urine Glucose: Negative  Fetal Status: Fetal Heart Rate (bpm): 142 Fundal Height: 25 cm Movement: Present     General:  Alert, oriented and cooperative. Patient is in no acute distress.  Skin: Skin is warm and dry. No rash noted.   Cardiovascular: Normal heart rate noted  Respiratory: Normal respiratory effort, no problems with respiration noted  Abdomen: Soft, gravid, appropriate for gestational age. Pain/Pressure: Absent     Pelvic:  Cervical exam deferred        Extremities: Normal range of motion.  Edema: None  Mental Status: Normal mood and affect. Normal behavior. Normal judgment and thought content.     Assessment   26 y.o. [id number] at 8461w2d, EDD  06/30/2017 by Ultrasound presenting for routine prenatal visit.  Plan   pregnancy Problems (from 12/19/16 to present)    Problem Noted Resolved   Supervision of low-risk pregnancy 12/19/2016 by Conard NovakJackson, Stephen D, MD No   Overview Addendum 01/10/2017  3:01 PM by Vena AustriaStaebler, Andreas, MD    [facility]inic [facility] Prenatal Labs  Dating 12 week u/s Blood type: A/Positive/-- (10/23 1523)   Genetic Screen 1 Screen: negative   AFP:  SMA and fragile X negative Antibody:Negative (10/23 1523)  Anatomic US  Rubella: 4.21 (10/23 1523)  VaricellaL Immune  GTT Third trimester:  RPR: Non Reactive (10/23 1523)   Rhogam n/a HBsAg: Negative (10/23 1523)   TDaP vaccine                       Flu Shot: HIV:   Negative  Baby Food                                GBS:   Contraception  Pap: ASCUS HPV pos 12/20/16  CBB   Colposcopy 01/10/17 no visible lesions follow up pap postpartum  Support Person             Discussed strategies for reducing high calorie foods and consuming smaller portion sizes when eating snacks. She is worried about GDM; discussed management and implications for pregnancy/delivery. GTT next visit.  Preterm labor symptoms and general obstetric precautions including but not limited to vaginal bleeding, contractions, leaking of fluid and fetal movement were reviewed in detail with the patient.  Return in about 4 weeks (around 04/09/2017) for ROB with GTT/28 week labs.  Marcelyn BruinsJacelyn Marguerita Stapp, CNM 03/12/2017  3:57 PM

## 2017-03-12 NOTE — Addendum Note (Signed)
Addended by: Marcelyn BruinsSCHMID, JACELYN on: 03/12/2017 04:03 PM   Modules accepted: Orders

## 2017-04-09 ENCOUNTER — Encounter: Payer: Medicaid Other | Admitting: Maternal Newborn

## 2017-04-09 ENCOUNTER — Ambulatory Visit (INDEPENDENT_AMBULATORY_CARE_PROVIDER_SITE_OTHER): Payer: Medicaid Other | Admitting: Maternal Newborn

## 2017-04-09 ENCOUNTER — Encounter: Payer: Self-pay | Admitting: Maternal Newborn

## 2017-04-09 ENCOUNTER — Other Ambulatory Visit: Payer: Medicaid Other

## 2017-04-09 VITALS — BP 120/80 | Wt 174.0 lb

## 2017-04-09 DIAGNOSIS — Z3492 Encounter for supervision of normal pregnancy, unspecified, second trimester: Secondary | ICD-10-CM

## 2017-04-09 DIAGNOSIS — Z3A28 28 weeks gestation of pregnancy: Secondary | ICD-10-CM

## 2017-04-09 DIAGNOSIS — Z3493 Encounter for supervision of normal pregnancy, unspecified, third trimester: Secondary | ICD-10-CM

## 2017-04-09 NOTE — Progress Notes (Signed)
C/o concerned about wt gain.rj

## 2017-04-09 NOTE — Progress Notes (Signed)
Routine Prenatal Care Visit  Subjective  Cynthia Coffey is a 27 y.o. G3P2002 at 3646w2d being seen today for ongoing prenatal care.  She is currently monitored for the following issues for this low-risk pregnancy and has Breast lump on right side at 10 o'clock position; Breast lump on left side at 3 o'clock position; Supervision of low-risk pregnancy; and ASCUS with positive high risk HPV cervical on their problem list.  ----------------------------------------------------------------------------------- Patient reports no complaints.   Contractions: Not present. Vag. Bleeding: None.  Movement: Present. Denies leaking of fluid.  ----------------------------------------------------------------------------------- The following portions of the patient's history were reviewed and updated as appropriate: allergies, current medications, past family history, past medical history, past social history, past surgical history and problem list. Problem list updated.   Objective  Blood pressure 120/80, weight 174 lb (78.9 kg), last menstrual period 09/20/2016. Pregravid weight 142 lb (64.4 kg) Total Weight Gain 32 lb (14.5 kg) Urinalysis: Urine Protein: Negative Urine Glucose: Negative  Fetal Status: Fetal Heart Rate (bpm): 142 Fundal Height: 26 cm Movement: Present     General:  Alert, oriented and cooperative. Patient is in no acute distress.  Skin: Skin is warm and dry. No rash noted.   Cardiovascular: Normal heart rate noted  Respiratory: Normal respiratory effort, no problems with respiration noted  Abdomen: Soft, gravid, appropriate for gestational age. Pain/Pressure: Absent     Pelvic:  Cervical exam deferred        Extremities: Normal range of motion.  Edema: None  Mental Status: Normal mood and affect. Normal behavior. Normal judgment and thought content.     Assessment   27 y.o. G3P2002 at 6046w2d by  06/30/2017, by Ultrasound presenting for routine prenatal visit  Plan    pregnancy Problems (from 12/19/16 to present)    Problem Noted Resolved   Supervision of low-risk pregnancy 12/19/2016 by Cynthia Coffey, Cynthia D, MD No   Overview Addendum 01/10/2017  3:01 PM by Cynthia Coffey, Andreas, MD    [facility]inic [facility] Prenatal Labs  Dating 12 week u/s Blood type: A/Positive/-- (10/23 1523)   Genetic Screen 1 Screen: negative   AFP:  SMA and fragile X negative Antibody:Negative (10/23 1523)  Anatomic US  Rubella: 4.21 (10/23 1523)  VaricellaL Immune  GTT Third trimester:  RPR: Non Reactive (10/23 1523)   Rhogam n/a HBsAg: Negative (10/23 1523)   TDaP vaccine                       Flu Shot: HIV:   Negative  Baby Food                                GBS:   Contraception  Pap: ASCUS HPV pos 12/20/16  CBB   Colposcopy 01/10/17 no visible lesions follow up pap postpartum  Support Person             Feels like she is less aware of fetal movements. Good fetal activity today with 5+ movements on exam and FHT 128-142. Advised to eat and rest while counting movements if she is unsure whether fetal activity is decreased, and to go to triage for NST if movements are inadequate or she is uncertain.  Still concerned about weight gain, again advised small frequent meals, portion control, especially with snacks, and increasing exercise/walking.  Preterm labor symptoms and general obstetric precautions including but not limited to vaginal bleeding, contractions, leaking of fluid and fetal movement  were reviewed in detail with the patient.  Return in about 2 weeks (around 04/23/2017) for ROB.  Cynthia Coffey, CNM 04/09/2017  1:46 PM

## 2017-04-10 LAB — 28 WEEK RH+PANEL
BASOS: 0 %
Basophils Absolute: 0 10*3/uL (ref 0.0–0.2)
EOS (ABSOLUTE): 0.1 10*3/uL (ref 0.0–0.4)
Eos: 1 %
Gestational Diabetes Screen: 137 mg/dL (ref 65–139)
HEMOGLOBIN: 11.9 g/dL (ref 11.1–15.9)
HIV SCREEN 4TH GENERATION: NONREACTIVE
Hematocrit: 36.3 % (ref 34.0–46.6)
IMMATURE GRANS (ABS): 0 10*3/uL (ref 0.0–0.1)
IMMATURE GRANULOCYTES: 0 %
Lymphocytes Absolute: 1.6 10*3/uL (ref 0.7–3.1)
Lymphs: 18 %
MCH: 33 pg (ref 26.6–33.0)
MCHC: 32.8 g/dL (ref 31.5–35.7)
MCV: 101 fL — ABNORMAL HIGH (ref 79–97)
MONOCYTES: 5 %
MONOS ABS: 0.4 10*3/uL (ref 0.1–0.9)
NEUTROS ABS: 6.6 10*3/uL (ref 1.4–7.0)
NEUTROS PCT: 76 %
Platelets: 230 10*3/uL (ref 150–379)
RBC: 3.61 x10E6/uL — AB (ref 3.77–5.28)
RDW: 13.3 % (ref 12.3–15.4)
RPR Ser Ql: NONREACTIVE
WBC: 8.6 10*3/uL (ref 3.4–10.8)

## 2017-04-13 ENCOUNTER — Other Ambulatory Visit: Payer: Self-pay | Admitting: Maternal Newborn

## 2017-04-13 DIAGNOSIS — Z3493 Encounter for supervision of normal pregnancy, unspecified, third trimester: Secondary | ICD-10-CM

## 2017-04-23 ENCOUNTER — Encounter: Payer: Medicaid Other | Admitting: Obstetrics & Gynecology

## 2017-05-03 ENCOUNTER — Ambulatory Visit (INDEPENDENT_AMBULATORY_CARE_PROVIDER_SITE_OTHER): Payer: Medicaid Other | Admitting: Obstetrics and Gynecology

## 2017-05-03 VITALS — BP 122/74 | Wt 182.0 lb

## 2017-05-03 DIAGNOSIS — Z3493 Encounter for supervision of normal pregnancy, unspecified, third trimester: Secondary | ICD-10-CM

## 2017-05-03 DIAGNOSIS — Z3A31 31 weeks gestation of pregnancy: Secondary | ICD-10-CM

## 2017-05-03 DIAGNOSIS — Z23 Encounter for immunization: Secondary | ICD-10-CM

## 2017-05-03 NOTE — Progress Notes (Signed)
Routine Prenatal Care Visit  Subjective  Cynthia Coffey is a 27 y.o. G3P2002 at 5271w5d being seen today for ongoing prenatal care.  She is currently monitored for the following issues for this low-risk pregnancy and has Breast lump on right side at 10 o'clock position; Breast lump on left side at 3 o'clock position; Supervision of low-risk pregnancy; and ASCUS with positive high risk HPV cervical on their problem list.  ----------------------------------------------------------------------------------- Patient reports fatigue.  Patient received Tdap today. Desires Flu at next visit. She declined flu today because she did not want two shots at once.  Contractions: Not present. Vag. Bleeding: None.  Movement: Present. Denies leaking of fluid.  ----------------------------------------------------------------------------------- The following portions of the patient's history were reviewed and updated as appropriate: allergies, current medications, past family history, past medical history, past social history, past surgical history and problem list. Problem list updated.   Objective  Blood pressure 122/74, weight 182 lb (82.6 kg), last menstrual period 09/20/2016. Pregravid weight 142 lb (64.4 kg) Total Weight Gain 40 lb (18.1 kg) Urinalysis:      Fetal Status:     Movement: Present     General:  Alert, oriented and cooperative. Patient is in no acute distress.  Skin: Skin is warm and dry. No rash noted.   Cardiovascular: Normal heart rate noted  Respiratory: Normal respiratory effort, no problems with respiration noted  Abdomen: Soft, gravid, appropriate for gestational age. Pain/Pressure: Present     Pelvic:  Cervical exam deferred        Extremities: Normal range of motion.     ental Status: Normal mood and affect. Normal behavior. Normal judgment and thought content.     Assessment   [age] y.o. G3P2002 at 2171w5d by  06/30/2017, by Ultrasound presenting for routine prenatal  visit  Plan   pregnancy Problems (from 12/19/16 to present)    Problem Noted Resolved   Supervision of low-risk pregnancy 12/19/2016 by Conard NovakJackson, Stephen D, MD No   Overview Addendum 05/03/2017  5:12 PM by Natale MilchSchuman, Charlann Wayne R, MD    [facility]inic [facility] Prenatal Labs  Dating 12 week u/s Blood type: A/Positive/-- (10/23 1523)   Genetic Screen 1 Screen: negative   AFP:  SMA and fragile X negative Antibody:Negative (10/23 1523)  Anatomic US  normal female Rubella: 4.21 (10/23 1523)  Varicella: Immune  GTT Third trimester: 137 RPR: Non Reactive (10/23 1523)   Rhogam n/a HBsAg: Negative (10/23 1523)   TDaP vaccine  05/03/2017                      Flu Shot: HIV:   Negative  Baby Food  Breast                              GBS:   Contraception  IUD Pap: ASCUS HPV pos 12/20/16  CBB  Given information  Colposcopy 01/10/17 no visible lesions follow up pap postpartum  Support Person                Gestational age appropriate obstetric precautions including but not limited to vaginal bleeding, contractions, leaking of fluid and fetal movement were reviewed in detail with the patient.    Given information on prenatal classes Given information on cord blood banking Given breastfeeding book Discussed postpartum contraception, desires an IUD  Return in about 2 weeks (around 05/17/2017) for ROB.  Adelene Idlerhristanna Ohana Birdwell MD [facility], [facility] 05/03/2017,  5:13 PM

## 2017-05-04 ENCOUNTER — Telehealth: Payer: Self-pay | Admitting: Obstetrics and Gynecology

## 2017-05-04 NOTE — Telephone Encounter (Signed)
Return in about 2 weeks (around 05/17/2017) for ROB. Patient didn't schedule follow up . Left voicemail for patient to call back to be schedule

## 2017-05-04 NOTE — Telephone Encounter (Signed)
Called and left message for patient to call back to be schedule °

## 2017-05-17 ENCOUNTER — Encounter: Payer: Medicaid Other | Admitting: Advanced Practice Midwife

## 2017-05-29 ENCOUNTER — Ambulatory Visit (INDEPENDENT_AMBULATORY_CARE_PROVIDER_SITE_OTHER): Payer: Medicaid Other | Admitting: Obstetrics and Gynecology

## 2017-05-29 ENCOUNTER — Encounter: Payer: Self-pay | Admitting: Obstetrics and Gynecology

## 2017-05-29 VITALS — BP 112/70 | Wt 188.0 lb

## 2017-05-29 DIAGNOSIS — Z3493 Encounter for supervision of normal pregnancy, unspecified, third trimester: Secondary | ICD-10-CM

## 2017-05-29 DIAGNOSIS — Z3A35 35 weeks gestation of pregnancy: Secondary | ICD-10-CM

## 2017-05-29 DIAGNOSIS — O26843 Uterine size-date discrepancy, third trimester: Secondary | ICD-10-CM | POA: Insufficient documentation

## 2017-05-29 NOTE — Progress Notes (Signed)
ROB

## 2017-05-29 NOTE — Progress Notes (Signed)
Routine Prenatal Care Visit  Subjective  Cynthia Coffey is a 27 y.o. G3P2002 at [redacted]w[redacted]d being seen today for ongoing prenatal care.  She is currently monitored for the following issues for this low-risk pregnancy and has Breast lump on right side at 10 o'clock position; Breast lump on left side at 3 o'clock position; Supervision of low-risk pregnancy; ASCUS with positive high risk HPV cervical; and Uterine size date discrepancy pregnancy, third trimester on their problem list.  ----------------------------------------------------------------------------------- Patient reports no complaints.   Contractions: Irritability. Vag. Bleeding: None.  Movement: Present. Denies leaking of fluid.  ----------------------------------------------------------------------------------- The following portions of the patient's history were reviewed and updated as appropriate: allergies, current medications, past family history, past medical history, past social history, past surgical history and problem list. Problem list updated.   Objective  Blood pressure 112/70, weight 188 lb (85.3 kg), last menstrual period 09/20/2016. Pregravid weight 142 lb (64.4 kg) Total Weight Gain 46 lb (20.9 kg) Urinalysis: Urine Protein: Trace Urine Glucose: Negative  Fetal Status: Fetal Heart Rate (bpm): 125 Fundal Height: 32 cm Movement: Present     General:  Alert, oriented and cooperative. Patient is in no acute distress.  Skin: Skin is warm and dry. No rash noted.   Cardiovascular: Normal heart rate noted  Respiratory: Normal respiratory effort, no problems with respiration noted  Abdomen: Soft, gravid, appropriate for gestational age. Pain/Pressure: Present     Pelvic:  Cervical exam deferred        Extremities: Normal range of motion.  Edema: None  ental Status: Normal mood and affect. Normal behavior. Normal judgment and thought content.     Assessment   27 y.o. G3P2002 at [redacted]w[redacted]d by  06/30/2017, by Ultrasound  presenting for routine prenatal visit  Plan   pregnancy Problems (from 12/19/16 to present)    Problem Noted Resolved   Uterine size date discrepancy pregnancy, third trimester 05/29/2017 by Cynthia Milch, MD No   Supervision of low-risk pregnancy 12/19/2016 by Cynthia Novak, MD No   Overview Addendum 05/03/2017  5:12 PM by Cynthia Milch, MD    [facility]inic [facility] Prenatal Labs  Dating 12 week u/s Blood type: A/Positive/-- (10/23 1523)   Genetic Screen 1 Screen: negative   AFP:  SMA and fragile X negative Antibody:Negative (10/23 1523)  Anatomic [place]  normal female Rubella: 4.21 (10/23 1523)  Varicella: Immune  GTT Third trimester: 137 RPR: Non Reactive (10/23 1523)   Rhogam n/a HBsAg: Negative (10/23 1523)   TDaP vaccine  05/03/2017                      Flu Shot: HIV:   Negative  Baby Food  Breast                              GBS:   Contraception  IUD Pap: ASCUS HPV pos 12/20/16  CBB  Given information  Colposcopy 01/10/17 no visible lesions follow up pap postpartum  Support Person                Gestational age appropriate obstetric precautions including but not limited to vaginal bleeding, contractions, leaking of fluid and fetal movement were reviewed in detail with the patient.    Patient given information on smoking cessation in pregnancy Uterine size less than dates, will order growth [place] for next visit. Cynthia Coffey has missed prenatal appointments because of work. Messaged sent pregnancy care manager,  she would benefit from their assistance.  GBS deferred because of her children present, will perform at next visit.  Return in about 1 week (around 06/05/2017) for ROB and growth US.  Cynthia Idlerhristanna Schuman MD [facility], [facility] 05/29/2017, 4:32 PM

## 2017-05-30 ENCOUNTER — Other Ambulatory Visit: Payer: Self-pay

## 2017-05-30 ENCOUNTER — Observation Stay
Admission: EM | Admit: 2017-05-30 | Discharge: 2017-05-30 | Disposition: A | Discharge status: Home / Self Care | Payer: Medicaid Other | Attending: Maternal Newborn | Admitting: Maternal Newborn

## 2017-05-30 ENCOUNTER — Encounter: Payer: Self-pay | Admitting: *Deleted

## 2017-05-30 DIAGNOSIS — R1084 Generalized abdominal pain: Secondary | ICD-10-CM | POA: Insufficient documentation

## 2017-05-30 DIAGNOSIS — O26843 Uterine size-date discrepancy, third trimester: Secondary | ICD-10-CM

## 2017-05-30 DIAGNOSIS — Z3A35 35 weeks gestation of pregnancy: Secondary | ICD-10-CM | POA: Insufficient documentation

## 2017-05-30 DIAGNOSIS — O26893 Other specified pregnancy related conditions, third trimester: Secondary | ICD-10-CM | POA: Diagnosis not present

## 2017-05-30 DIAGNOSIS — Z3493 Encounter for supervision of normal pregnancy, unspecified, third trimester: Secondary | ICD-10-CM

## 2017-05-30 DIAGNOSIS — R109 Unspecified abdominal pain: Secondary | ICD-10-CM | POA: Diagnosis present

## 2017-05-30 HISTORY — DX: Other specified health status: Z78.9

## 2017-05-30 LAB — URINALYSIS, ROUTINE W REFLEX MICROSCOPIC
BILIRUBIN URINE: NEGATIVE
GLUCOSE, UA: NEGATIVE mg/dL
Hgb urine dipstick: NEGATIVE
KETONES UR: 80 mg/dL — AB
LEUKOCYTES UA: NEGATIVE
Nitrite: NEGATIVE
PH: 6 (ref 5.0–8.0)
Protein, ur: NEGATIVE mg/dL
SPECIFIC GRAVITY, URINE: 1.024 (ref 1.005–1.030)

## 2017-05-30 MED ORDER — PANTOPRAZOLE SODIUM 40 MG PO TBEC
40.0000 mg | DELAYED_RELEASE_TABLET | Freq: Every day | ORAL | Status: DC
  Administered 2017-05-30: 40 mg via ORAL
  Filled 2017-05-30: qty 1

## 2017-05-30 MED ORDER — PANTOPRAZOLE SODIUM 40 MG PO TBEC: 40.0000 mg | DELAYED_RELEASE_TABLET | Freq: Every day | ORAL | 2 refills | Status: DC

## 2017-05-30 MED ORDER — ACETAMINOPHEN 325 MG PO TABS
650.0000 mg | ORAL_TABLET | ORAL | Status: DC | PRN
  Administered 2017-05-30: 650 mg via ORAL
  Filled 2017-05-30: qty 2

## 2017-05-30 MED ORDER — LACTATED RINGERS IV BOLUS
500.0000 mL | Freq: Once | INTRAVENOUS | Status: AC
Start: 2017-05-30 — End: 2017-05-30
  Administered 2017-05-30: 500 mL via INTRAVENOUS

## 2017-05-30 NOTE — Final Progress Note (Signed)
Physician Final Progress Note  Patient ID: Cynthia Coffey MRN: [id number] DOB/AGE: [birth year] 28 y.o.  Admit date: 05/30/2017 Admitting provider: Natale Milch, MD Discharge date: 05/30/2017   Admission Diagnoses: Abdominal pain  Discharge Diagnoses:  Active Problems:   Abdominal pain during pregnancy, third trimester   History of Present Illness: The patient is a 27 y.o. female G3P2002 at [redacted]w[redacted]d who presents for abdominal pain that began this afternoon after she got off work. It is intermittent and feels like it is burning and occasionally sharp. It started in her upper abdomen but is now "all over" her abdominal area. Rates pain about 6/10. She tried resting and taking a bath, which did not decrease her symptoms. Has had some diarrhea, but does not exactly recall the time of onset of the loose stools. Denies dysuria, loss of fluid, and vaginal bleeding.   10 point review of systems negative unless otherwise noted in HPI.  Past Medical History:  Diagnosis Date  . Medical history non-contributory     Past Surgical History:  Procedure Laterality Date  . NO PAST SURGERIES      No current facility-administered medications on file prior to encounter.    Current Outpatient Medications on File Prior to Encounter  Medication Sig Dispense Refill  . Prenatal Vit-Fe Fumarate-FA (PRENATAL VITAMIN PO) Take by mouth.      No Known Allergies  Social History   Socioeconomic History  . Marital status: Single    Spouse name: Not on file  . Number of children: Not on file  . Years of education: Not on file  . Highest education level: Not on file  Occupational History  . Not on file  Social Needs  . Financial resource strain: Not on file  . Food insecurity:    Worry: Not on file    Inability: Not on file  . Transportation needs:    Medical: Not on file    Non-medical: Not on file  Tobacco Use  . Smoking status: Former Smoker    Packs/day: 0.25    Types: Cigarettes  .  Smokeless tobacco: Never Used  Substance and Sexual Activity  . Alcohol use: No  . Drug use: Yes    Types: Marijuana    Comment: no use since 20 weeks  . Sexual activity: Yes    Birth control/protection: IUD    Comment: unsure  Lifestyle  . Physical activity:    Days per week: Not on file    Minutes per session: Not on file  . Stress: Not on file  Relationships  . Social connections:    Talks on phone: Not on file    Gets together: Not on file    Attends religious service: Not on file    Active member of club or organization: Not on file    Attends meetings of clubs or organizations: Not on file    Relationship status: Not on file  . Intimate partner violence:    Fear of current or ex partner: Not on file    Emotionally abused: Not on file    Physically abused: Not on file    Forced sexual activity: Not on file  Other Topics Concern  . Not on file  Social History Narrative  . Not on file    Physical Exam: BP (!) 111/92   Pulse 76   Temp 98.7 F (37.1 C) (Oral)   LMP 09/20/2016 (Approximate)   Gen: NAD CV: regular rate Pulm: no increased work of breathing Abdomen:  soft, gravid, non-tender to palpation Pelvic: closed/thick/high on two consecutive SVE 2 hours apart Ext: no signs of DVT on exam  NST:  Baseline: 135 Variability: moderate Accelerations: present Decelerations: absent Tocometry: occasional uterine irritability, no palpable contractions while patient is experiencing pains The patient was monitored for 30+ minutes, fetal heart rate tracing was deemed reactive, category I tracing.  Consults: None  Significant Findings/ Diagnostic Studies: labs: UA negative  Procedures: SVE, NST  Discharge Condition: stable  Disposition: Discharge disposition: 01-Home or Self Care       Diet: Regular diet  Discharge Activity: Activity as tolerated  Discharge Instructions    Discharge activity:  No Restrictions   Complete by:  As directed    Discharge  diet:  No restrictions   Complete by:  As directed    Fetal Kick Count:  Lie on our left side for one hour after a meal, and count the number of times your baby kicks.  If it is less than 5 times, get up, move around and drink some juice.  Repeat the test 30 minutes later.  If it is still less than 5 kicks in an hour, notify your doctor.   Complete by:  As directed    LABOR:  When conractions begin, you should start to time them from the beginning of one contraction to the beginning  of the next.  When contractions are 5 - 10 minutes apart or less and have been regular for at least an hour, you should call your health care provider.   Complete by:  As directed    No sexual activity restrictions   Complete by:  As directed    Notify physician for bleeding from the vagina   Complete by:  As directed    Notify physician for blurring of vision or spots before the eyes   Complete by:  As directed    Notify physician for chills or fever   Complete by:  As directed    Notify physician for fainting spells, "black outs" or loss of consciousness   Complete by:  As directed    Notify physician for increase in vaginal discharge   Complete by:  As directed    Notify physician for leaking of fluid   Complete by:  As directed    Notify physician for pain or burning when urinating   Complete by:  As directed    Notify physician for pelvic pressure (sudden increase)   Complete by:  As directed    Notify physician for severe or continued nausea or vomiting   Complete by:  As directed    Notify physician for sudden gushing of fluid from the vagina (with or without continued leaking)   Complete by:  As directed    Notify physician for sudden, constant, or occasional abdominal pain   Complete by:  As directed    Notify physician if baby moving less than usual   Complete by:  As directed      Allergies as of 05/30/2017   No Known Allergies     Medication List    TAKE these medications   pantoprazole  40 MG tablet Commonly known as:  PROTONIX Take 1 tablet (40 mg total) by mouth daily. Start taking on:  05/31/2017   PRENATAL VITAMIN PO Take by mouth.      Advised that symptoms may be due to viral gastroenteritis and should rest, drink fluids, eat bland diet. Return to care with any labor symptoms.  Total time  spent taking care of this patient: 20 minutes  Signed: Oswaldo ConroyJacelyn Y Schmid, CNM  05/30/2017, 9:37 PM

## 2017-05-30 NOTE — Progress Notes (Deleted)
Pt may have 10ml Ephedrine now for hypotension, can repeat with 5cc Ephedrine in 5 mins if still hypotensive and infuse 500cc bolus now. Adjusting Epidural infusion to 10cc/hr.

## 2017-05-30 NOTE — OB Triage Note (Signed)
Pt states she feels same as when she arrived to triage, still having mid and lower abdominal pain, aware that she is not in preterm labor as explained by CNM, says she still feels like inside are being ripped open, recalls eating taco today that her co-workers cooked, apple and banana. Says she did not feel sick and have this pain until after she left work. Discharge teaching provided to pt. Encouraged her to po hydrate and take otc meds as recommended by provider. Advised pt to f/u with OB provider if pain became worse or no relief in next 24-48 hours.

## 2017-05-30 NOTE — Progress Notes (Signed)
Spoke with CNM about pt getting IVF bolus prior to discharge to see if that would help relieve some discomfort, CNM agrees, pt states she can wait for IV fluid bolus to complete and then she has to go get her kids.

## 2017-05-30 NOTE — Discharge Summary (Signed)
See Final Progress Note 05/30/2017.  Marcelyn BruinsJacelyn Densil Ottey, CNM 05/30/2017  9:41 PM

## 2017-05-30 NOTE — OB Triage Note (Signed)
Constant abdominal pain that feels sharp and burning starting around 3pm. Denies LOF or vaginal bleeding. Pt had had diarrhea and states baby has not been moving as well.

## 2017-05-30 NOTE — Progress Notes (Signed)
IVF bolus completed. Pt says she still feels pain in mid and lower abdomen, discharge teaching provided and advised pt to drink fluid, follow BRAT diet as tolerated, f/u with provider if pain persist or worsens. Pt states she feels fine to drive home. Discharge paperwork given to pt.

## 2017-06-04 ENCOUNTER — Other Ambulatory Visit: Payer: Self-pay | Admitting: Obstetrics and Gynecology

## 2017-06-04 ENCOUNTER — Ambulatory Visit (INDEPENDENT_AMBULATORY_CARE_PROVIDER_SITE_OTHER): Payer: Medicaid Other | Admitting: Obstetrics and Gynecology

## 2017-06-04 ENCOUNTER — Ambulatory Visit (INDEPENDENT_AMBULATORY_CARE_PROVIDER_SITE_OTHER): Payer: Medicaid Other

## 2017-06-04 ENCOUNTER — Telehealth: Payer: Self-pay

## 2017-06-04 ENCOUNTER — Encounter: Payer: Self-pay | Admitting: Obstetrics and Gynecology

## 2017-06-04 VITALS — BP 108/72 | Wt 185.0 lb

## 2017-06-04 DIAGNOSIS — Z3493 Encounter for supervision of normal pregnancy, unspecified, third trimester: Secondary | ICD-10-CM

## 2017-06-04 DIAGNOSIS — O365930 Maternal care for other known or suspected poor fetal growth, third trimester, not applicable or unspecified: Secondary | ICD-10-CM | POA: Diagnosis not present

## 2017-06-04 DIAGNOSIS — O365990 Maternal care for other known or suspected poor fetal growth, unspecified trimester, not applicable or unspecified: Secondary | ICD-10-CM | POA: Insufficient documentation

## 2017-06-04 DIAGNOSIS — O0993 Supervision of high risk pregnancy, unspecified, third trimester: Secondary | ICD-10-CM

## 2017-06-04 DIAGNOSIS — Z3A36 36 weeks gestation of pregnancy: Secondary | ICD-10-CM

## 2017-06-04 DIAGNOSIS — O26843 Uterine size-date discrepancy, third trimester: Secondary | ICD-10-CM

## 2017-06-04 LAB — FETAL NONSTRESS TEST

## 2017-06-04 NOTE — Progress Notes (Signed)
Pt concerned about baby's growth. GBS/aptima today.

## 2017-06-04 NOTE — Progress Notes (Signed)
Routine Prenatal Care Visit  Subjective  Cynthia Coffey is a 27 y.o. G3P2002 at [redacted]w[redacted]d being seen today for ongoing prenatal care.  She is currently monitored for the following issues for this high-risk pregnancy and has Breast lump on right side at 10 o'clock position; Breast lump on left side at 3 o'clock position; Supervision of high risk pregnancy in third trimester; ASCUS with positive high risk HPV cervical; Uterine size date discrepancy pregnancy, third trimester; Abdominal pain during pregnancy, third trimester; and IUGR (intrauterine growth restriction) affecting care of mother on their problem list.  ----------------------------------------------------------------------------------- Patient reports no complaints.   Contractions: Irritability. Vag. Bleeding: None.  Movement: Present. Denies leaking of fluid.  ----------------------------------------------------------------------------------- The following portions of the patient's history were reviewed and updated as appropriate: allergies, current medications, past family history, past medical history, past social history, past surgical history and problem list. Problem list updated.   Objective  Blood pressure 108/72, weight 185 lb (83.9 kg), last menstrual period 09/20/2016. Pregravid weight 142 lb (64.4 kg) Total Weight Gain 43 lb (19.5 kg) Urinalysis: Urine Protein: Negative Urine Glucose: Negative  Fetal Status: Fetal Heart Rate (bpm): 140 Fundal Height: 32 cm Movement: Present  Presentation: Vertex  General:  Alert, oriented and cooperative. Patient is in no acute distress.  Skin: Skin is warm and dry. No rash noted.   Cardiovascular: Normal heart rate noted  Respiratory: Normal respiratory effort, no problems with respiration noted  Abdomen: Soft, gravid, appropriate for gestational age. Pain/Pressure: Absent     Pelvic:  Cervical exam deferred        Extremities: Normal range of motion.  Edema: None  ental Status:  Normal mood and affect. Normal behavior. Normal judgment and thought content.     Assessment   26 y.o. G3P2002 at [redacted]w[redacted]d by  06/30/2017, by Ultrasound presenting for routine prenatal visit  Plan   pregnancy Problems (from 12/19/16 to present)    Problem Noted Resolved   IUGR (intrauterine growth restriction) affecting care of mother 06/04/2017 by Vena Austria, MD No   Uterine size date discrepancy pregnancy, third trimester 05/29/2017 by Natale Milch, MD No   Supervision of high risk pregnancy in third trimester 12/19/2016 by Conard Novak, MD No   Overview Addendum 06/04/2017  2:44 PM by Natale Milch, MD    [facility]inic [facility] Prenatal Labs  Dating 12 week u/s Blood type: A/Positive/-- (10/23 1523)   Genetic Screen 1 Screen: negative   AFP:  SMA and fragile X negative Antibody:Negative (10/23 1523)  Anatomic [place]  normal female Rubella: 4.21 (10/23 1523)  Varicella: Immune  GTT Third trimester: 137 RPR: Non Reactive (10/23 1523)   Rhogam n/a HBsAg: Negative (10/23 1523)   TDaP vaccine  05/03/2017                      Flu Shot: HIV:   Negative  Baby Food  Breast                              GBS:   Contraception  IUD Pap: ASCUS HPV pos 12/20/16  CBB  Given information  Colposcopy 01/10/17 no visible lesions follow up pap postpartum  Support Person  FOB: DJ, demarcus               Gestational age appropriate obstetric precautions including but not limited to vaginal bleeding, contractions, leaking of fluid and fetal movement were reviewed  in detail with the patient.    IUGR less than 2% on all measurements with elevated UA dopplers. Will place referral to MFM and have patient return on Thursday for BPP and NST. Will plan for delivery at 37 weeks per ACOG guidelines. IOL scheduled for Sunday at 8am.  Patient was referred to pregnancy care manager at her last visit.  GBS, Gonorrhea and chlamydia collected today.  Return in about 3 days (around 06/07/2017) for BPP  US/NST and ROB .  Cynthia Coffey  [facility], [facility] 06/04/2017, 7:22 PM

## 2017-06-04 NOTE — Telephone Encounter (Signed)
Pt called to speak with provider about induction plan

## 2017-06-07 ENCOUNTER — Ambulatory Visit (INDEPENDENT_AMBULATORY_CARE_PROVIDER_SITE_OTHER): Payer: Medicaid Other

## 2017-06-07 ENCOUNTER — Ambulatory Visit (INDEPENDENT_AMBULATORY_CARE_PROVIDER_SITE_OTHER): Payer: Medicaid Other | Admitting: Obstetrics and Gynecology

## 2017-06-07 VITALS — BP 120/70 | Wt 187.0 lb

## 2017-06-07 DIAGNOSIS — Z3A36 36 weeks gestation of pregnancy: Secondary | ICD-10-CM

## 2017-06-07 DIAGNOSIS — O0993 Supervision of high risk pregnancy, unspecified, third trimester: Secondary | ICD-10-CM

## 2017-06-07 DIAGNOSIS — O26843 Uterine size-date discrepancy, third trimester: Secondary | ICD-10-CM

## 2017-06-07 DIAGNOSIS — O365930 Maternal care for other known or suspected poor fetal growth, third trimester, not applicable or unspecified: Secondary | ICD-10-CM

## 2017-06-07 LAB — FETAL NONSTRESS TEST

## 2017-06-07 LAB — GC/CHLAMYDIA PROBE AMP
Chlamydia trachomatis, NAA: NEGATIVE
NEISSERIA GONORRHOEAE BY PCR: NEGATIVE

## 2017-06-07 LAB — CULTURE, BETA STREP (GROUP B ONLY): STREP GP B CULTURE: POSITIVE — AB

## 2017-06-07 NOTE — Progress Notes (Signed)
Routine Prenatal Care Visit  Subjective  Cynthia Coffey is a 27 y.o. G3P2002 at 2563w5d being seen today for ongoing prenatal care.  She is currently monitored for the following issues for this high-risk pregnancy and has Breast lump on right side at 10 o'clock position; Breast lump on left side at 3 o'clock position; Supervision of high risk pregnancy in third trimester; ASCUS with positive high risk HPV cervical; Uterine size date discrepancy pregnancy, third trimester; Abdominal pain during pregnancy, third trimester; and IUGR (intrauterine growth restriction) affecting care of mother on their problem list.  ----------------------------------------------------------------------------------- Patient reports no complaints.   Contractions: Not present. Vag. Bleeding: None.  Movement: Present. Denies leaking of fluid.  ----------------------------------------------------------------------------------- The following portions of the patient's history were reviewed and updated as appropriate: allergies, current medications, past family history, past medical history, past social history, past surgical history and problem list. Problem list updated.   Objective  Last menstrual period 09/20/2016. Pregravid weight 142 lb (64.4 kg) Total Weight Gain 45 lb (20.4 kg) Urinalysis:      Fetal Status:     Movement: Present     General:  Alert, oriented and cooperative. Patient is in no acute distress.  Skin: Skin is warm and dry. No rash noted.   Cardiovascular: Normal heart rate noted  Respiratory: Normal respiratory effort, no problems with respiration noted  Abdomen: Soft, gravid, appropriate for gestational age. Pain/Pressure: Absent     Pelvic:  Cervical exam deferred        Extremities: Normal range of motion.     ental Status: Normal mood and affect. Normal behavior. Normal judgment and thought content.   NST: 130s, moderate variability, no decelerations. 15x15 accelerations. Category I.    BPP: 8/8  Assessment   26 y.o. [id number] at 5063w5d by  06/30/2017, by Ultrasound presenting for routine prenatal visit  Plan   pregnancy Problems (from 12/19/16 to present)    Problem Noted Resolved   IUGR (intrauterine growth restriction) affecting care of mother 06/04/2017 by Vena AustriaStaebler, Andreas, MD No   Uterine size date discrepancy pregnancy, third trimester 05/29/2017 by Natale MilchSchuman, Tyrik Stetzer R, MD No   Supervision of high risk pregnancy in third trimester 12/19/2016 by Conard NovakJackson, Stephen D, MD No   Overview Addendum 06/04/2017  2:44 PM by Natale MilchSchuman, Syretta Kochel R, MD    [facility] Prenatal Labs  Dating 12 week u/s Blood type: A/Positive/-- (10/23 1523)   Genetic Screen 1 Screen: negative   AFP:  SMA and fragile X negative Antibody:Negative (10/23 1523)  Anatomic US  normal female Rubella: 4.21 (10/23 1523)  Varicella: Immune  GTT Third trimester: 137 RPR: Non Reactive (10/23 1523)   Rhogam n/a HBsAg: Negative (10/23 1523)   TDaP vaccine  05/03/2017                      Flu Shot: HIV:   Negative  Baby Food  Breast                              GBS:   Contraception  IUD Pap: ASCUS HPV pos 12/20/16  CBB  Given information  Colposcopy 01/10/17 no visible lesions follow up pap postpartum  Support Person  FOB: DJ, demarcus               Gestational age appropriate obstetric precautions including but not limited to vaginal bleeding, contractions, leaking of fluid and fetal movement were reviewed in  detail with the patient.     IUGR with elevated UA dopplers, IOL planned for [redacted] week gestation.  Given information on volunteer doula program.  Dorena Dew not available today because of illness. Return in about 6 weeks (around 07/19/2017) for postpartum visit/ IUD insertion.  Adelene Idler MD [facility], [facility] 06/07/2017, 12:13 PM

## 2017-06-07 NOTE — Progress Notes (Signed)
GBS positive, Gonorrhea and chlamydia negative. Will discuss with patient at 06/07/17 visit (today).

## 2017-06-08 ENCOUNTER — Other Ambulatory Visit: Payer: Medicaid Other

## 2017-06-10 ENCOUNTER — Inpatient Hospital Stay: Payer: Medicaid Other | Admitting: Anesthesiology

## 2017-06-10 ENCOUNTER — Inpatient Hospital Stay
Admission: EM | Admit: 2017-06-10 | Discharge: 2017-06-13 | DRG: 787 | Disposition: A | Discharge status: Home / Self Care | Payer: Medicaid Other | Attending: Obstetrics and Gynecology | Admitting: Obstetrics and Gynecology

## 2017-06-10 ENCOUNTER — Encounter
Admission: EM | Disposition: A | Discharge status: Home / Self Care | Payer: Self-pay | Source: Home / Self Care | Attending: Obstetrics and Gynecology

## 2017-06-10 ENCOUNTER — Other Ambulatory Visit: Payer: Self-pay

## 2017-06-10 ENCOUNTER — Encounter: Payer: Self-pay | Admitting: *Deleted

## 2017-06-10 DIAGNOSIS — D62 Acute posthemorrhagic anemia: Secondary | ICD-10-CM | POA: Diagnosis not present

## 2017-06-10 DIAGNOSIS — O0993 Supervision of high risk pregnancy, unspecified, third trimester: Secondary | ICD-10-CM

## 2017-06-10 DIAGNOSIS — O9081 Anemia of the puerperium: Secondary | ICD-10-CM | POA: Diagnosis not present

## 2017-06-10 DIAGNOSIS — Z87891 Personal history of nicotine dependence: Secondary | ICD-10-CM | POA: Diagnosis not present

## 2017-06-10 DIAGNOSIS — Z3A37 37 weeks gestation of pregnancy: Secondary | ICD-10-CM

## 2017-06-10 DIAGNOSIS — O26843 Uterine size-date discrepancy, third trimester: Secondary | ICD-10-CM

## 2017-06-10 DIAGNOSIS — O365930 Maternal care for other known or suspected poor fetal growth, third trimester, not applicable or unspecified: Secondary | ICD-10-CM | POA: Diagnosis present

## 2017-06-10 DIAGNOSIS — O99824 Streptococcus B carrier state complicating childbirth: Secondary | ICD-10-CM | POA: Diagnosis present

## 2017-06-10 DIAGNOSIS — O365990 Maternal care for other known or suspected poor fetal growth, unspecified trimester, not applicable or unspecified: Secondary | ICD-10-CM | POA: Diagnosis present

## 2017-06-10 LAB — CBC
HEMATOCRIT: 35.5 % (ref 35.0–47.0)
HEMOGLOBIN: 12 g/dL (ref 12.0–16.0)
MCH: 34 pg (ref 26.0–34.0)
MCHC: 33.8 g/dL (ref 32.0–36.0)
MCV: 100.5 fL — ABNORMAL HIGH (ref 80.0–100.0)
Platelets: 188 10*3/uL (ref 150–440)
RBC: 3.53 MIL/uL — ABNORMAL LOW (ref 3.80–5.20)
RDW: 13.9 % (ref 11.5–14.5)
WBC: 7.5 10*3/uL (ref 3.6–11.0)

## 2017-06-10 LAB — TYPE AND SCREEN
ABO/RH(D): A POS
Antibody Screen: NEGATIVE

## 2017-06-10 SURGERY — Surgical Case
Anesthesia: Spinal

## 2017-06-10 MED ORDER — LACTATED RINGERS IV SOLN
INTRAVENOUS | Status: DC
  Administered 2017-06-10: 09:00:00 via INTRAVENOUS

## 2017-06-10 MED ORDER — SOD CITRATE-CITRIC ACID 500-334 MG/5ML PO SOLN
30.0000 mL | ORAL | Status: AC
  Administered 2017-06-10: 30 mL via ORAL

## 2017-06-10 MED ORDER — BUPIVACAINE HCL (PF) 0.5 % IJ SOLN
INTRAMUSCULAR | Status: DC | PRN
  Administered 2017-06-10: 5 mL

## 2017-06-10 MED ORDER — SODIUM CHLORIDE 0.9 % IV SOLN
INTRAVENOUS | Status: DC | PRN
  Administered 2017-06-10: 50 ug/min via INTRAVENOUS

## 2017-06-10 MED ORDER — SODIUM CHLORIDE 0.9 % IV SOLN
5.0000 10*6.[IU] | Freq: Once | INTRAVENOUS | Status: AC
  Administered 2017-06-10: 5 10*6.[IU] via INTRAVENOUS
  Filled 2017-06-10: qty 5

## 2017-06-10 MED ORDER — BUPIVACAINE IN DEXTROSE 0.75-8.25 % IT SOLN
INTRATHECAL | Status: DC | PRN
  Administered 2017-06-10: 1.6 mL via INTRATHECAL

## 2017-06-10 MED ORDER — OXYTOCIN 40 UNITS IN LACTATED RINGERS INFUSION - SIMPLE MED
INTRAVENOUS | Status: AC
  Filled 2017-06-10: qty 1000

## 2017-06-10 MED ORDER — OXYTOCIN 40 UNITS IN LACTATED RINGERS INFUSION - SIMPLE MED
2.5000 [IU]/h | INTRAVENOUS | Status: AC
  Administered 2017-06-10: 2.5 [IU]/h via INTRAVENOUS
  Filled 2017-06-10: qty 1000

## 2017-06-10 MED ORDER — ONDANSETRON HCL 4 MG/2ML IJ SOLN
4.0000 mg | Freq: Three times a day (TID) | INTRAMUSCULAR | Status: DC | PRN
  Administered 2017-06-10: 4 mg via INTRAVENOUS
  Filled 2017-06-10: qty 2

## 2017-06-10 MED ORDER — MEPERIDINE HCL 25 MG/ML IJ SOLN: 6.2500 mg | INTRAMUSCULAR | Status: DC | PRN

## 2017-06-10 MED ORDER — BUPIVACAINE ON-Q PAIN PUMP (FOR ORDER SET NO CHG)
INJECTION | Status: AC
  Filled 2017-06-10: qty 1

## 2017-06-10 MED ORDER — MISOPROSTOL 25 MCG QUARTER TABLET
25.0000 ug | ORAL_TABLET | ORAL | Status: DC | PRN
  Administered 2017-06-10: 25 ug via VAGINAL
  Filled 2017-06-10: qty 1

## 2017-06-10 MED ORDER — BUPIVACAINE HCL (PF) 0.5 % IJ SOLN
INTRAMUSCULAR | Status: AC
  Filled 2017-06-10: qty 30

## 2017-06-10 MED ORDER — TERBUTALINE SULFATE 1 MG/ML IJ SOLN
0.2500 mg | Freq: Once | INTRAMUSCULAR | Status: DC | PRN
  Filled 2017-06-10: qty 1

## 2017-06-10 MED ORDER — ONDANSETRON HCL 4 MG/2ML IJ SOLN: 4.0000 mg | Freq: Four times a day (QID) | INTRAMUSCULAR | Status: DC | PRN

## 2017-06-10 MED ORDER — MORPHINE SULFATE (PF) 0.5 MG/ML IJ SOLN
INTRAMUSCULAR | Status: DC | PRN
  Administered 2017-06-10: .1 mg via INTRATHECAL

## 2017-06-10 MED ORDER — NALBUPHINE HCL 10 MG/ML IJ SOLN: 5.0000 mg | INTRAMUSCULAR | Status: DC | PRN

## 2017-06-10 MED ORDER — DIBUCAINE 1 % RE OINT: 1.0000 "application " | TOPICAL_OINTMENT | RECTAL | Status: DC | PRN

## 2017-06-10 MED ORDER — SIMETHICONE 80 MG PO CHEW
80.0000 mg | CHEWABLE_TABLET | ORAL | Status: DC | PRN
Start: 2017-06-10 — End: 2017-06-13
  Administered 2017-06-11 – 2017-06-12 (×2): 80 mg via ORAL
  Filled 2017-06-10 (×2): qty 1

## 2017-06-10 MED ORDER — WITCH HAZEL-GLYCERIN EX PADS: 1.0000 "application " | MEDICATED_PAD | CUTANEOUS | Status: DC | PRN

## 2017-06-10 MED ORDER — BUPIVACAINE HCL (PF) 0.5 % IJ SOLN: 30.0000 mL | INTRAMUSCULAR | Status: DC

## 2017-06-10 MED ORDER — FENTANYL CITRATE (PF) 100 MCG/2ML IJ SOLN
INTRAMUSCULAR | Status: DC | PRN
  Administered 2017-06-10: 15 ug via INTRATHECAL

## 2017-06-10 MED ORDER — COCONUT OIL OIL
1.0000 "application " | TOPICAL_OIL | Status: DC | PRN
  Filled 2017-06-10: qty 120

## 2017-06-10 MED ORDER — LACTATED RINGERS IV SOLN: INTRAVENOUS | Status: DC

## 2017-06-10 MED ORDER — MENTHOL 3 MG MT LOZG
1.0000 | LOZENGE | OROMUCOSAL | Status: DC | PRN
  Filled 2017-06-10: qty 9

## 2017-06-10 MED ORDER — ACETAMINOPHEN 325 MG PO TABS
650.0000 mg | ORAL_TABLET | ORAL | Status: DC | PRN
  Administered 2017-06-10 – 2017-06-11 (×2): 650 mg via ORAL
  Filled 2017-06-10: qty 2

## 2017-06-10 MED ORDER — SODIUM CHLORIDE 0.9 % IJ SOLN
INTRAMUSCULAR | Status: AC
  Filled 2017-06-10: qty 50

## 2017-06-10 MED ORDER — SIMETHICONE 80 MG PO CHEW
80.0000 mg | CHEWABLE_TABLET | Freq: Three times a day (TID) | ORAL | Status: DC
  Administered 2017-06-11 – 2017-06-13 (×7): 80 mg via ORAL
  Filled 2017-06-10 (×7): qty 1

## 2017-06-10 MED ORDER — SOD CITRATE-CITRIC ACID 500-334 MG/5ML PO SOLN
30.0000 mL | ORAL | Status: DC | PRN
  Filled 2017-06-10: qty 15

## 2017-06-10 MED ORDER — NALOXONE HCL 0.4 MG/ML IJ SOLN: 0.4000 mg | INTRAMUSCULAR | Status: DC | PRN

## 2017-06-10 MED ORDER — OXYTOCIN BOLUS FROM INFUSION: 500.0000 mL | Freq: Once | INTRAVENOUS | Status: DC

## 2017-06-10 MED ORDER — BUPIVACAINE 0.25 % ON-Q PUMP DUAL CATH 400 ML
400.0000 mL | INJECTION | Status: DC
  Filled 2017-06-10: qty 400

## 2017-06-10 MED ORDER — KETOROLAC TROMETHAMINE 30 MG/ML IJ SOLN
30.0000 mg | Freq: Four times a day (QID) | INTRAMUSCULAR | Status: DC
  Administered 2017-06-10 – 2017-06-11 (×3): 30 mg via INTRAVENOUS
  Filled 2017-06-10 (×3): qty 1

## 2017-06-10 MED ORDER — NALBUPHINE HCL 10 MG/ML IJ SOLN
5.0000 mg | Freq: Once | INTRAMUSCULAR | Status: AC | PRN
  Administered 2017-06-10: 5 mg via INTRAVENOUS
  Filled 2017-06-10: qty 1

## 2017-06-10 MED ORDER — FENTANYL CITRATE (PF) 100 MCG/2ML IJ SOLN
INTRAMUSCULAR | Status: AC
  Filled 2017-06-10: qty 2

## 2017-06-10 MED ORDER — OXYCODONE-ACETAMINOPHEN 5-325 MG PO TABS: 1.0000 | ORAL_TABLET | ORAL | Status: DC | PRN

## 2017-06-10 MED ORDER — DIPHENHYDRAMINE HCL 25 MG PO CAPS: 25.0000 mg | ORAL_CAPSULE | Freq: Four times a day (QID) | ORAL | Status: DC | PRN

## 2017-06-10 MED ORDER — DIPHENHYDRAMINE HCL 50 MG/ML IJ SOLN
12.5000 mg | INTRAMUSCULAR | Status: DC | PRN
  Administered 2017-06-10: 12.5 mg via INTRAVENOUS
  Filled 2017-06-10: qty 1

## 2017-06-10 MED ORDER — OXYCODONE-ACETAMINOPHEN 5-325 MG PO TABS: 2.0000 | ORAL_TABLET | ORAL | Status: DC | PRN

## 2017-06-10 MED ORDER — SENNOSIDES-DOCUSATE SODIUM 8.6-50 MG PO TABS
2.0000 | ORAL_TABLET | ORAL | Status: DC
Start: 2017-06-11 — End: 2017-06-13
  Administered 2017-06-12: 2 via ORAL
  Filled 2017-06-10 (×3): qty 2

## 2017-06-10 MED ORDER — CEFAZOLIN SODIUM-DEXTROSE 2-4 GM/100ML-% IV SOLN
2.0000 g | INTRAVENOUS | Status: AC
  Administered 2017-06-10: 2 g via INTRAVENOUS
  Filled 2017-06-10: qty 100

## 2017-06-10 MED ORDER — OXYCODONE HCL 5 MG PO TABS
5.0000 mg | ORAL_TABLET | ORAL | Status: DC | PRN
  Administered 2017-06-12 – 2017-06-13 (×2): 5 mg via ORAL
  Filled 2017-06-10 (×2): qty 1

## 2017-06-10 MED ORDER — MORPHINE SULFATE (PF) 0.5 MG/ML IJ SOLN
INTRAMUSCULAR | Status: AC
  Filled 2017-06-10: qty 10

## 2017-06-10 MED ORDER — DIPHENHYDRAMINE HCL 25 MG PO CAPS
25.0000 mg | ORAL_CAPSULE | ORAL | Status: DC | PRN
  Administered 2017-06-11: 25 mg via ORAL
  Filled 2017-06-10: qty 1

## 2017-06-10 MED ORDER — BUPIVACAINE LIPOSOME 1.3 % IJ SUSP
INTRAMUSCULAR | Status: AC
  Filled 2017-06-10: qty 20

## 2017-06-10 MED ORDER — PENICILLIN G POTASSIUM 5000000 UNITS IJ SOLR
2.5000 10*6.[IU] | INTRAVENOUS | Status: DC
  Filled 2017-06-10 (×3): qty 2.5

## 2017-06-10 MED ORDER — TERBUTALINE SULFATE 1 MG/ML IJ SOLN
0.2500 mg | Freq: Once | INTRAMUSCULAR | Status: AC | PRN
  Administered 2017-06-10: 0.25 mg via SUBCUTANEOUS

## 2017-06-10 MED ORDER — OXYTOCIN 40 UNITS IN LACTATED RINGERS INFUSION - SIMPLE MED
2.5000 [IU]/h | INTRAVENOUS | Status: DC
  Administered 2017-06-10: 1000 mL via INTRAVENOUS

## 2017-06-10 MED ORDER — LIDOCAINE HCL (PF) 1 % IJ SOLN: 30.0000 mL | INTRAMUSCULAR | Status: DC | PRN

## 2017-06-10 MED ORDER — OXYCODONE HCL 5 MG PO TABS
10.0000 mg | ORAL_TABLET | ORAL | Status: DC | PRN
  Administered 2017-06-13 (×2): 10 mg via ORAL
  Filled 2017-06-10 (×2): qty 2

## 2017-06-10 MED ORDER — ACETAMINOPHEN 325 MG PO TABS
650.0000 mg | ORAL_TABLET | Freq: Four times a day (QID) | ORAL | Status: AC
  Administered 2017-06-11 (×3): 650 mg via ORAL
  Filled 2017-06-10 (×4): qty 2

## 2017-06-10 MED ORDER — KETOROLAC TROMETHAMINE 30 MG/ML IJ SOLN: 30.0000 mg | Freq: Four times a day (QID) | INTRAMUSCULAR | Status: DC

## 2017-06-10 MED ORDER — IBUPROFEN 600 MG PO TABS
600.0000 mg | ORAL_TABLET | Freq: Four times a day (QID) | ORAL | Status: DC
  Administered 2017-06-11 – 2017-06-13 (×9): 600 mg via ORAL
  Filled 2017-06-10 (×9): qty 1

## 2017-06-10 MED ORDER — SIMETHICONE 80 MG PO CHEW: 80.0000 mg | CHEWABLE_TABLET | ORAL | Status: DC

## 2017-06-10 MED ORDER — PENICILLIN G POT IN DEXTROSE 60000 UNIT/ML IV SOLN
3.0000 10*6.[IU] | INTRAVENOUS | Status: DC
  Administered 2017-06-10: 3 10*6.[IU] via INTRAVENOUS
  Filled 2017-06-10 (×7): qty 50

## 2017-06-10 MED ORDER — OXYTOCIN 40 UNITS IN LACTATED RINGERS INFUSION - SIMPLE MED: 1.0000 m[IU]/min | INTRAVENOUS | Status: DC

## 2017-06-10 MED ORDER — NALBUPHINE HCL 10 MG/ML IJ SOLN
5.0000 mg | INTRAMUSCULAR | Status: DC | PRN
  Administered 2017-06-11: 5 mg via INTRAVENOUS
  Filled 2017-06-10: qty 1

## 2017-06-10 MED ORDER — PHENYLEPHRINE HCL 10 MG/ML IJ SOLN
INTRAMUSCULAR | Status: DC | PRN
  Administered 2017-06-10: 100 ug via INTRAVENOUS

## 2017-06-10 MED ORDER — SODIUM CHLORIDE 0.9% FLUSH: 3.0000 mL | INTRAVENOUS | Status: DC | PRN

## 2017-06-10 MED ORDER — PRENATAL MULTIVITAMIN CH
1.0000 | ORAL_TABLET | Freq: Every day | ORAL | Status: DC
  Administered 2017-06-11 – 2017-06-13 (×3): 1 via ORAL
  Filled 2017-06-10 (×3): qty 1

## 2017-06-10 MED ORDER — LACTATED RINGERS IV SOLN
INTRAVENOUS | Status: DC | PRN
  Administered 2017-06-10: 17:00:00 via INTRAVENOUS

## 2017-06-10 MED ORDER — LACTATED RINGERS IV SOLN
500.0000 mL | INTRAVENOUS | Status: DC | PRN
  Administered 2017-06-10: 500 mL via INTRAVENOUS

## 2017-06-10 MED ORDER — NALBUPHINE HCL 10 MG/ML IJ SOLN: 5.0000 mg | Freq: Once | INTRAMUSCULAR | Status: AC | PRN

## 2017-06-10 SURGICAL SUPPLY — 29 items
BAG COUNTER SPONGE EZ (MISCELLANEOUS) ×2 IMPLANT
CANISTER SUCT 3000ML PPV (MISCELLANEOUS) ×3 IMPLANT
CATH KIT ON-Q SILVERSOAK 5IN (CATHETERS) ×6 IMPLANT
CHLORAPREP W/TINT 26ML (MISCELLANEOUS) ×6 IMPLANT
CLOSURE WOUND 1/2 X4 (GAUZE/BANDAGES/DRESSINGS)
COUNTER SPONGE BAG EZ (MISCELLANEOUS) ×1
DERMABOND ADVANCED (GAUZE/BANDAGES/DRESSINGS) ×2
DERMABOND ADVANCED .7 DNX12 (GAUZE/BANDAGES/DRESSINGS) ×1 IMPLANT
DRSG OPSITE POSTOP 4X10 (GAUZE/BANDAGES/DRESSINGS) ×3 IMPLANT
DRSG TELFA 3X8 NADH (GAUZE/BANDAGES/DRESSINGS) IMPLANT
ELECT CAUTERY BLADE 6.4 (BLADE) ×3 IMPLANT
ELECT REM PT RETURN 9FT ADLT (ELECTROSURGICAL) ×3
ELECTRODE REM PT RTRN 9FT ADLT (ELECTROSURGICAL) ×1 IMPLANT
GAUZE SPONGE 4X4 12PLY STRL (GAUZE/BANDAGES/DRESSINGS) IMPLANT
GLOVE BIO SURGEON STRL SZ7 (GLOVE) ×15 IMPLANT
GLOVE INDICATOR 7.5 STRL GRN (GLOVE) ×3 IMPLANT
GOWN STRL REUS W/ TWL LRG LVL3 (GOWN DISPOSABLE) ×3 IMPLANT
GOWN STRL REUS W/TWL LRG LVL3 (GOWN DISPOSABLE) ×6
NS IRRIG 1000ML POUR BTL (IV SOLUTION) ×3 IMPLANT
PACK C SECTION AR (MISCELLANEOUS) ×3 IMPLANT
PAD OB MATERNITY 4.3X12.25 (PERSONAL CARE ITEMS) ×3 IMPLANT
PAD PREP 24X41 OB/GYN DISP (PERSONAL CARE ITEMS) ×3 IMPLANT
STAPLER INSORB 30 2030 C-SECTI (MISCELLANEOUS) ×3 IMPLANT
STRIP CLOSURE SKIN 1/2X4 (GAUZE/BANDAGES/DRESSINGS) IMPLANT
SUT MNCRL AB 4-0 PS2 18 (SUTURE) ×3 IMPLANT
SUT PDS AB 1 TP1 96 (SUTURE) ×6 IMPLANT
SUT VIC AB 0 CTX 36 (SUTURE) ×4
SUT VIC AB 0 CTX36XBRD ANBCTRL (SUTURE) ×2 IMPLANT
SUT VIC AB 2-0 CT1 36 (SUTURE) ×3 IMPLANT

## 2017-06-10 NOTE — H&P (Signed)
Obstetric H&P   Chief Complaint: IOL  Prenatal Care Provider: [facility]  History of Present Illness: 27 y.o. [name] [redacted]w[redacted]d by 06/30/2017, by 12 week Ultrasound presenting to L&D for induction secondary to IUGR with abnormal doppler studies.  Pregnancy otherwise uncomplicated, appropriate weight gain.  No LOF, no VB, no ctx.  Pregravid weight 142 lb (64.4 kg) Total Weight Gain 43 lb (19.5 kg)  pregnancy Problems (from 12/19/16 to 06/10/17)    Problem Noted Resolved   IUGR (intrauterine growth restriction) affecting care of mother 06/04/2017 by Vena Austria, MD No   Overview Signed 06/10/2017 10:16 AM by Vena Austria, MD    4lbs 13oz (2184 grams) c/w <10%ile with HC, AC, and FL <2nd percentile UA S/D doppler elevated at 3.71      Supervision of high risk pregnancy in third trimester 12/19/2016 by Conard Novak, MD No   Overview Addendum 06/10/2017 10:16 AM by Vena Austria, MD    [facility]inic [facility] Prenatal Labs  Dating 12 week u/s Blood type: A/Positive/-- (10/23 1523)   Genetic Screen 1 Screen: negative  SMA and fragile X negative Antibody:Negative (10/23 1523)  Anatomic [place]  normal female Rubella: 4.21 (10/23 1523)  Varicella: Immune  GTT Third trimester: 137 RPR: Non Reactive (02/11 1202)   Rhogam n/a HBsAg: Negative (10/23 1523)   TDaP vaccine  05/03/2017                      Flu Shot: HIV: Non Reactive (02/11 1202) Negative  Baby Food  Breast                              GBS:  positive  Contraception  IUD Pap: ASCUS HPV pos 12/20/16  CBB  Given information  Colposcopy 01/10/17 no visible lesions follow up pap postpartum  Support Person  FOB: DJ, Demarcus           Uterine size date discrepancy pregnancy, third trimester 05/29/2017 by Natale Milch, MD 06/10/2017 by Vena Austria, MD       Review of Systems: 10 point review of systems negative unless otherwise noted in HPI  Past Medical History: Past Medical History:  Diagnosis Date  . Medical history  non-contributory     Past Surgical History: Past Surgical History:  Procedure Laterality Date  . NO PAST SURGERIES      Past Obstetric History: #: 1, Date: None, Sex: None, Weight: None, GA: None, Delivery: None, Apgar1: None, Apgar5: None, Living: None, Birth Comments: None  #: 2, Date: None, Sex: None, Weight: None, GA: None, Delivery: None, Apgar1: None, Apgar5: None, Living: None, Birth Comments: None  #: 3, Date: None, Sex: None, Weight: None, GA: None, Delivery: None, Apgar1: None, Apgar5: None, Living: None, Birth Comments: None   Past Gynecologic History:  Family History: Family History  Problem Relation Age of Onset  . Diabetes Paternal Grandfather   . Colon cancer Mother   . Throat cancer Paternal Grandmother     Social History: Social History   Socioeconomic History  . Marital status: Single    Spouse name: Not on file  . Number of children: Not on file  . Years of education: Not on file  . Highest education level: Not on file  Occupational History  . Not on file  Social Needs  . Financial resource strain: Not on file  . Food insecurity:    Worry: Not on file  Inability: Not on file  . Transportation needs:    Medical: Not on file    Non-medical: Not on file  Tobacco Use  . Smoking status: Former Smoker    Packs/day: 0.25    Types: Cigarettes    Last attempt to quit: 10/10/2016    Years since quitting: 0.6  . Smokeless tobacco: Never Used  Substance and Sexual Activity  . Alcohol use: No  . Drug use: Not Currently    Types: Marijuana    Comment: no use since 20 weeks  . Sexual activity: Yes    Birth control/protection: IUD    Comment: unsure  Lifestyle  . Physical activity:    Days per week: Not on file    Minutes per session: Not on file  . Stress: Not on file  Relationships  . Social connections:    Talks on phone: Not on file    Gets together: Not on file    Attends religious service: Not on file    Active member of club or  organization: Not on file    Attends meetings of clubs or organizations: Not on file    Relationship status: Not on file  . Intimate partner violence:    Fear of current or ex partner: Not on file    Emotionally abused: Not on file    Physically abused: Not on file    Forced sexual activity: Not on file  Other Topics Concern  . Not on file  Social History Narrative  . Not on file    Medications: Prior to Admission medications   Medication Sig Start Date End Date Taking? Authorizing Provider  acetaminophen (TYLENOL) 500 MG tablet Take 1,000 mg by mouth every 6 (six) hours as needed.   Yes [provider]  Prenatal Vit-Fe Fumarate-FA (PRENATAL VITAMIN PO) Take by mouth.   Yes [provider]  pantoprazole (PROTONIX) 40 MG tablet Take 1 tablet (40 mg total) by mouth daily. Patient not taking: Reported on 06/04/2017 05/31/17   Oswaldo Conroy, CNM    Allergies: No Known Allergies  Physical Exam: Vitals: Blood pressure 120/74, pulse 98, temperature 98.3 F (36.8 C), temperature source Oral, resp. rate 14, height 5\' 3"  (1.6 m), weight 185 lb (83.9 kg), last menstrual period 09/20/2016, unknown if currently breastfeeding.  Urine Dip Protein: N/A  FHT: 125, moderate, +accels, no decels Toco: none  General: NAD HEENT: normocephalic, anicteric Pulmonary: No increased work of breathing Cardiovascular: RRR, distal pulses 2+ Abdomen: Gravid, non-tender Leopolds: Genitourinary:  Dilation: 1 Effacement (%): 20, 30 Cervical Position: Posterior Station: -3 Exam by:: J.Braddy RN  Extremities: no edema, erythema, or tenderness Neurologic: Grossly intact Psychiatric: mood appropriate, affect full  Labs: Results for orders placed or performed during the hospital encounter of 06/10/17 (from the past 24 hour(s))  CBC     Status: Abnormal   Collection Time: 06/10/17  8:45 AM  Result Value Ref Range   WBC 7.5 3.6 - 11.0 K/uL   RBC 3.53 (L) 3.80 - 5.20 MIL/uL    Hemoglobin 12.0 12.0 - 16.0 g/dL   HCT 16.1 09.6 - 04.5 %   MCV 100.5 (H) 80.0 - 100.0 fL   MCH 34.0 26.0 - 34.0 pg   MCHC 33.8 32.0 - 36.0 g/dL   RDW 40.9 81.1 - 91.4 %   Platelets 188 150 - 440 K/uL  Type and screen     Status: None   Collection Time: 06/10/17  8:45 AM  Result Value Ref Range  ABO/RH(D) A POS    Antibody Screen NEG    Sample Expiration      06/13/2017 Performed at [facility], [address] [place], [place] [address]     Assessment: 27 y.o. [id number] 1760w1d by 06/30/2017, by 12 wee Ultrasound presenting for IOL secondary to IUGR  Plan: 1) IUGR - IUGR with abnormal doppler studies are the indication for early term delivery in this patient coinciding with recommendation in the ACOG practice bulletin on later preterm early term deliveries.   - Cytotec for cervical rippening  2) Fetus - cat I tracing  3) PNL - Blood type --/--/A POS (04/14 0845) / Anti-bodyscreen NEG (04/14 0845) / Rubella 4.21 (10/23 1523) / Varicella Immune / RPR Non Reactive (02/11 1202) / HBsAg Negative (10/23 1523) / HIV Non Reactive (02/11 1202) / 1-hr OGTT 137 / GBS positive - PCN for GBS ppx  4) Immunization History -  Immunization History  Administered Date(s) Administered  . Tdap 05/03/2017    5) Disposition - pending delivery  Vena AustriaAndreas Jacquan Savas, MD, Merlinda FrederickFACOG [facility], [facility] 06/10/2017, 10:17 AM

## 2017-06-10 NOTE — Anesthesia Post-op Follow-up Note (Signed)
Anesthesia QCDR form completed.        

## 2017-06-10 NOTE — Progress Notes (Signed)
   Subjective:  Feeling some cramping no painful contractions  Objective:   Vitals: Blood pressure 120/81, pulse 83, temperature 98.3 F (36.8 C), temperature source Oral, resp. rate 14, height 5\' 3"  (1.6 m), weight 185 lb (83.9 kg), last menstrual period 09/20/2016, unknown if currently breastfeeding. General: NAD Abdomen: gravid, non-tender Cervical Exam:  Dilation: 1 Effacement (%): 30 Cervical Position: Posterior Station: -3 Exam by:: Natsumi Whitsitt  FHT: 120, moderat, +accels, thee late appearing deceleration Toco: was nontracint q791min briefly prior t odeceleration appear to have resolved with contractions spacing out to q2-833min  Results for orders placed or performed during the hospital encounter of 06/10/17 (from the past 24 hour(s))  CBC     Status: Abnormal   Collection Time: 06/10/17  8:45 AM  Result Value Ref Range   WBC 7.5 3.6 - 11.0 K/uL   RBC 3.53 (L) 3.80 - 5.20 MIL/uL   Hemoglobin 12.0 12.0 - 16.0 g/dL   HCT 16.135.5 09.635.0 - 04.547.0 %   MCV 100.5 (H) 80.0 - 100.0 fL   MCH 34.0 26.0 - 34.0 pg   MCHC 33.8 32.0 - 36.0 g/dL   RDW 40.913.9 81.111.5 - 91.414.5 %   Platelets 188 150 - 440 K/uL  Type and screen     Status: None   Collection Time: 06/10/17  8:45 AM  Result Value Ref Range   ABO/RH(D) A POS    Antibody Screen NEG    Sample Expiration      06/13/2017 Performed at [facility], [address] [place], [place] [address]     Assessment:   27 y.o. [phone] 2240w1d   Plan:   1) Labor - if category I tracing proceed with pitocin, contracting to frequentl for more cytotec and just had 3 deceleration that appear to have responded to fluid bolus and position changes  2) Fetus - category II, was category 1 throughout the day thus far, may have been secondary to a brief episode of tachysystole  Vena AustriaAndreas Eldora Napp, MD, Merlinda FrederickFACOG [facility], [facility] 06/10/2017, 3:20 PM

## 2017-06-10 NOTE — Plan of Care (Signed)
Pt. Transferred to room 339. Alert and oriented with aprop. Affect. Color good, skin w&d. BBS clear. Oriented to room and Fall precautions. [facility] Provided with [facility] phone number. V/O of orientation, Fall precautions and POC. Pt. V/O.

## 2017-06-10 NOTE — Op Note (Signed)
Preoperative Diagnosis: 1) 27 y.o. G3P2002 at [redacted]w[redacted]d 2) Fetal intolerance to labor 3) IUGR 4) Elevated Umbilical artery doppler  Postoperative Diagnosis: 1) 27 y.o. [name] at [redacted]w[redacted]d 2) Fetal intolerance to labor 3) IUGR 4) Elevated Umbilical artery doppler  Operation Performed: Primary low transverse C-section via pfannenstiel skin incision  Indication: fetal intolerance to labor  Anesthesia: .Spinal  Primary Surgeon: Vena Austria, MD  Preoperative Antibiotics: 2g ancef  Estimated Blood Loss: 700 mL  IV Fluids:  Drains or Tubes: Foley to gravity drainage, ON-Q catheter system  Implants: none  Specimens Removed: none  Complications: none  Intraoperative Findings:  Normal tubes ovaries and uterus.  Delivery resulted in the birth of a liveborn female,  APGAR (1 MIN): 8   APGAR (5 MINS): 9   Weight 5lbs 10oz  Patient Condition: stable  Procedure in Detail:  Patient was taken to the operating room were she was administered regional anesthesia.  She was positioned in the supine position, prepped and draped in the  Usual sterile fashion.  Prior to proceeding with the case a time out was performed and the level of anesthetic was checked and noted to be adequate.  Utilizing the scalpel a pfannenstiel skin incision was made 2cm above the pubic symphysis and carried down sharply to the the level of the rectus fascia.  The fascia was incised in the midline using the scalpel and then extended using mayo scissors.  The superior border of the rectus fascia was grasped with two Kocher clamps and the underlying rectus muscles were dissected of the fascia using blunt dissection.  The median raphae was incised using Mayo scissors.   The inferior border of the rectus fascia was dissected of the rectus muscles in a similar fashion.  The midline was identified, the peritoneum was entered bluntly and expanded using manual tractions.  The uterus was noted to be in a none rotated  position.  Next the bladder blade was placed retracting the bladder caudally.  A bladder flap was not created.  A low transverse incision was scored on the lower uterine segment.  The hysterotomy was entered bluntly using the operators finger.  The hysterotomy incision was extended using manual traction.  The operators hand was placed within the hysterotomy position noting the fetus to be within the OA position.  The vertex was grasped, flexed, brought to the incision, and delivered a traumatically using fundal pressure.  The remainder of the body delivered with ease.  The infant was suctioned, cord was clamped and cut before handing off to the awaiting neonatologist.  The placenta was delivered using manual extraction.  The uterus was exteriorized, wiped clean of clots and debris using two moist laps.  The hysterotomy was closed using a two layer closure of 0 Vicryl, with the first being a running locked, the second a vertical imbricating.  The uterus was returned to the abdomen.  The peritoneal gutters were wiped clean of clots and debris using two moist laps.  The hysterotomy incision was re-inspected noted to be hemostatic.  The rectus muscles were inspected noted to be hemostatic.  The superior border of the rectus fascia was grasped with a Kocher clamp.  The ON-Q trocars were then placed 4cm above the superior border of the incision and tunneled subfascially.  The introducers were removed and the catheters were threaded through the sleeves after which the sleeves were removed.  The fascia was closed using a looped #1 PDS in a running fashion taking 1cm by 1cm bites.  The subcutaneous tissue was irrigated using warm saline, hemostasis achieved using the bovie.  The subcutaneous dead space was less than 3cm and was not closed.  The skin was closed using Insorb staples.  The left ON-Q catheter did not flush and was removed in its entirety.  Sponge needle and instrument counts were corrects times two.  The  patient tolerated the procedure well and was taken to the recovery room in stable condition.

## 2017-06-10 NOTE — Progress Notes (Signed)
   Subjective:  Feeling light contractions  Objective:   Vitals: Blood pressure 111/67, pulse (!) 57, temperature 98.3 F (36.8 C), temperature source Oral, resp. rate 14, height 5\' 3"  (1.6 m), weight 185 lb (83.9 kg), last menstrual period 09/20/2016, unknown if currently breastfeeding. General: NAD Abdomen: gravid, non-tender Cervical Exam:  Dilation: 1 Effacement (%): 30 Cervical Position: Posterior Station: -3 Exam by:: Tiernan Suto  FHT: 130, moderate, no accels, continued intermitten late deceleration with a 2 min deceleration into the 80's at 16:08 Toco:  Results for orders placed or performed during the hospital encounter of 06/10/17 (from the past 24 hour(s))  CBC     Status: Abnormal   Collection Time: 06/10/17  8:45 AM  Result Value Ref Range   WBC 7.5 3.6 - 11.0 K/uL   RBC 3.53 (L) 3.80 - 5.20 MIL/uL   Hemoglobin 12.0 12.0 - 16.0 g/dL   HCT 86.535.5 78.435.0 - 69.647.0 %   MCV 100.5 (H) 80.0 - 100.0 fL   MCH 34.0 26.0 - 34.0 pg   MCHC 33.8 32.0 - 36.0 g/dL   RDW 29.513.9 28.411.5 - 13.214.5 %   Platelets 188 150 - 440 K/uL  Type and screen     Status: None   Collection Time: 06/10/17  8:45 AM  Result Value Ref Range   ABO/RH(D) A POS    Antibody Screen NEG    Sample Expiration      06/13/2017 Performed at [facility], [address] [place], [place] [address]     Assessment:   27 y.o. G3P2002 7236w1d IOL IUGR  Plan:   1) Labor - patient received 1 dose of cytotec, category I tracing initially but then began displaying intermittent late deceleration.  These did not respond to interventions including position changes, fluid bolus, and supplemental O2.  Given early in labor, IUGR with abnormal UA S/D dopplers concern for placental insufficiency.  The likelihood of worsening fetal distress was discussed with the patient if continued attempts to move towards vaginal delivery.  The patient was counseled regarding risk and benefits to proceeding with Cesarean section to expedite  delivery.  Risk of cesarean section were discussed including risk of bleeding and need for potential intraoperative or postoperative blood transfusion with a rate of approximately 5% quoted for all Cesarean sections, risk of injury to adjacent organs including but not limited to bowl and bladder, the need for additional surgical procedures to address such injuries, and the risk of infection.  After consideration of options the patient is amenable to proceed with primary cesarean section for delivery.  2) Fetus - fetal heart tracing remains category II, despite attempts at intervention.    Vena AustriaAndreas Aviyana Sonntag, MD, Evern CoreFACOG [facility], [facility] 06/10/2017, 4:30 PM

## 2017-06-10 NOTE — Anesthesia Preprocedure Evaluation (Addendum)
Anesthesia Evaluation  Patient identified by MRN, date of birth, ID band Patient awake    Reviewed: Allergy & Precautions, NPO status , Patient's Chart, lab work & pertinent test results  History of Anesthesia Complications Negative for: history of anesthetic complications  Airway Mallampati: II  TM Distance: >3 FB Neck ROM: Full    Dental no notable dental hx.    Pulmonary neg sleep apnea, neg COPD, former smoker,    breath sounds clear to auscultation- rhonchi (-) wheezing      Cardiovascular Exercise Tolerance: Good (-) hypertension(-) CAD, (-) Past MI, (-) Cardiac Stents and (-) CABG  Rhythm:Regular Rate:Normal - Systolic murmurs and - Diastolic murmurs    Neuro/Psych negative neurological ROS  negative psych ROS   GI/Hepatic negative GI ROS, Neg liver ROS,   Endo/Other  negative endocrine ROSneg diabetes  Renal/GU negative Renal ROS     Musculoskeletal negative musculoskeletal ROS (+)   Abdominal Gravid abdomen  Peds  Hematology negative hematology ROS (+)   Anesthesia Other Findings IOL for IUGR  Reproductive/Obstetrics (+) Pregnancy                             Lab Results  Component Value Date   WBC 7.5 06/10/2017   HGB 12.0 06/10/2017   HCT 35.5 06/10/2017   MCV 100.5 (H) 06/10/2017   PLT 188 06/10/2017    Anesthesia Physical Anesthesia Plan  ASA: II  Anesthesia Plan: Spinal   Post-op Pain Management:    Induction:   PONV Risk Score and Plan: 2 and Ondansetron  Airway Management Planned: Natural Airway  Additional Equipment:   Intra-op Plan:   Post-operative Plan:   Informed Consent: I have reviewed the patients History and Physical, chart, labs and discussed the procedure including the risks, benefits and alternatives for the proposed anesthesia with the patient or authorized representative who has indicated his/her understanding and acceptance.   Dental  advisory given  Plan Discussed with: CRNA and Anesthesiologist  Anesthesia Plan Comments: (C-section called for fetal intolerance of labor in setting of IUGR with abnormal UA dopplers)        Anesthesia Quick Evaluation

## 2017-06-10 NOTE — Transfer of Care (Signed)
Immediate Anesthesia Transfer of Care Note  Patient: Cynthia Coffey  Procedure(s) Performed: CESAREAN SECTION (N/A )  Patient Location: PACU and Nursing Unit  Anesthesia Type:Spinal  Level of Consciousness: awake, alert  and oriented  Airway & Oxygen Therapy: Patient Spontanous Breathing  Post-op Assessment: Report given to RN and Post -op Vital signs reviewed and stable  Post vital signs: Reviewed and stable  Last Vitals:  Vitals Value Taken Time  BP 112/65 06/10/2017  6:10 PM  Temp    Pulse 94 06/10/2017  6:11 PM  Resp 20 06/10/2017  6:11 PM  SpO2 100 % 06/10/2017  6:11 PM  Vitals shown include unvalidated device data.  Last Pain:  Vitals:   06/10/17 0946  TempSrc:   PainSc: 0-No pain         Complications: No apparent anesthesia complications

## 2017-06-10 NOTE — Discharge Summary (Signed)
Obstetric Discharge Summary Reason for Admission: induction of labor/IUGR Prenatal Procedures: none Intrapartum Procedures: cesarean: low cervical, transverse Postpartum Procedures: none Complications-Operative and Postpartum: none Hemoglobin  Date Value Ref Range Status  06/11/2017 11.0 (L) 12.0 - 16.0 g/dL Final  16/10/960402/12/2017 54.011.9 11.1 - 15.9 g/dL Final   HCT  Date Value Ref Range Status  06/11/2017 32.1 (L) 35.0 - 47.0 % Final   Hematocrit  Date Value Ref Range Status  04/09/2017 36.3 34.0 - 46.6 % Final    Physical Exam:  General: alert, cooperative and appears stated age 89Lochia: appropriate Uterine Fundus: firm Incision: Dressing C/D/I DVT Evaluation: No evidence of DVT seen on physical exam.  Discharge Diagnoses: Preterm Pregnancy-delivered  Discharge Information: Date: 06/13/2017 Activity: pelvic rest and lifting restriction 10lbs for 6 weeks Diet: routine Allergies as of 06/13/2017   No Known Allergies     Medication List    STOP taking these medications   acetaminophen 500 MG tablet Commonly known as:  TYLENOL   pantoprazole 40 MG tablet Commonly known as:  PROTONIX     TAKE these medications   oxyCODONE-acetaminophen 5-325 MG tablet Commonly known as:  PERCOCET/ROXICET Take 1-2 tablets by mouth every 6 (six) hours as needed.   PRENATAL VITAMIN PO Take by mouth.            Discharge Care Instructions  (From admission, onward)        Start     Ordered   06/13/17 0000  Discharge wound care:    Comments:  You may apply a light dressing for minor discharge from the incision or to keep waistbands of clothing from rubbing.  You may also have been discharge with a clear dressing in which case this will be removed at your postoperative clinic visit.  You may shower, use soap on your incision.  Avoid baths or soaking the incision in the first 6 weeks following your surgery.Marland Kitchen.   06/13/17 1421      Condition: stable Discharge to: home Follow-up  Information    Vena AustriaStaebler, Andreas, MD In 1 week.   Specialty:  Obstetrics and Gynecology Why:  For wound re-check Contact information: [address] [place] [address] [phone]           Newborn Data: Live born female  Birth Weight: 5 lb 10 oz (2550 g) APGAR: 8, 9  Newborn Delivery   Birth date/time:  06/10/2017 17:21:00 Delivery type:  C-Section, Low Transverse C-section categorization:  Primary    Baby remains in SCN until discharged by pediatric team.  Cynthia ConroyJacelyn Y [name] 06/13/2017, 3:57 PM

## 2017-06-11 ENCOUNTER — Encounter: Payer: Self-pay | Admitting: Obstetrics and Gynecology

## 2017-06-11 LAB — CBC
HCT: 32.1 % — ABNORMAL LOW (ref 35.0–47.0)
Hemoglobin: 11 g/dL — ABNORMAL LOW (ref 12.0–16.0)
MCH: 34.1 pg — AB (ref 26.0–34.0)
MCHC: 34.4 g/dL (ref 32.0–36.0)
MCV: 99.1 fL (ref 80.0–100.0)
PLATELETS: 159 10*3/uL (ref 150–440)
RBC: 3.24 MIL/uL — AB (ref 3.80–5.20)
RDW: 13.4 % (ref 11.5–14.5)
WBC: 9.5 10*3/uL (ref 3.6–11.0)

## 2017-06-11 LAB — RPR: RPR: NONREACTIVE

## 2017-06-11 NOTE — Lactation Note (Signed)
This note was copied from a baby's chart. Lactation Consultation Note  Patient Name: Cynthia Chinita PesterMaranda Nevares BJYNW'GToday's Date: 06/11/2017  Mom pumping breasts every 3 hrs, pumped last at 1035 and obtained drops   Maternal Data    Feeding Feeding Type: Donor Breast Milk Nipple Type: Slow - flow Length of feed: 25 min  LATCH Score                   Interventions    Lactation Tools Discussed/Used     Consult Status      Dyann KiefMarsha D Senya Hinzman 06/11/2017, 2:11 PM

## 2017-06-11 NOTE — Progress Notes (Signed)
Alert and oriented with pleasant affect. Color good, skin w&d. BBS clear. Fundus is firm at U/E with scant flow. Pain controled with On Q pump. C/O itching and received Nubain at 0005 and PO Benadryl at 0358. Will follow for relief. Assisted with Pumping X2 and Pt. Has not had any Colostrum as yet and I have encouraged her to continue with pumping to stimulate production. Foley Cathter is draining clear, amber Urine and will d/C as per standing order near change of shift. Pt. Appears comfortable and in NAD.

## 2017-06-11 NOTE — Progress Notes (Signed)
POD #1 s/p LTCS for FITL Subjective:  Doing well. Tolerating regular diet, voiding without difficulty, minimal pain Baby in NICU with temperature and O2 sat problems   Objective:  Blood pressure 120/88, pulse 65, temperature 97.8 F (36.6 C), temperature source Oral, resp. rate 18, height 5\' 3"  (1.6 m), weight 83.9 kg (185 lb), last menstrual period 09/20/2016, SpO2 100 %, unknown if currently breastfeeding.  Urine output 1100 ml  General: WF in, sitting on side of bed in NAD Pulmonary: no increased work of breathing/CTAB Heart: RRR without murmur Abdomen: non-distended, non-tender, bowel sounds active Incision: Honey comb dressing C+D+I, ON Q intact Extremities: no edema, no erythema, no tenderness  Results for orders placed or performed during the hospital encounter of 06/10/17 (from the past 72 hour(s))  CBC     Status: Abnormal   Collection Time: 06/10/17  8:45 AM  Result Value Ref Range   WBC 7.5 3.6 - 11.0 K/uL   RBC 3.53 (L) 3.80 - 5.20 MIL/uL   Hemoglobin 12.0 12.0 - 16.0 g/dL   HCT 82.935.5 56.235.0 - 13.047.0 %   MCV 100.5 (H) 80.0 - 100.0 fL   MCH 34.0 26.0 - 34.0 pg   MCHC 33.8 32.0 - 36.0 g/dL   RDW 86.513.9 78.411.5 - 69.614.5 %   Platelets 188 150 - 440 K/uL    Comment: Performed at [facility], [address] [place], [place] [address]  RPR     Status: None   Collection Time: 06/10/17  8:45 AM  Result Value Ref Range   RPR Ser Ql Non Reactive Non Reactive    Comment: (NOTE) Performed At: [facility] [address] [place], [place] [id number] Jolene SchimkeNagendra Sanjai MD [phone] Performed at [facility], [address] [place], [place] [address]   Type and screen     Status: None   Collection Time: 06/10/17  8:45 AM  Result Value Ref Range   ABO/RH(D) A POS    Antibody Screen NEG    Sample Expiration      06/13/2017 Performed at [facility], [address] [place], [place] [address]   CBC     Status: Abnormal   Collection Time:  06/11/17  5:05 AM  Result Value Ref Range   WBC 9.5 3.6 - 11.0 K/uL   RBC 3.24 (L) 3.80 - 5.20 MIL/uL   Hemoglobin 11.0 (L) 12.0 - 16.0 g/dL   HCT 56.432.1 (L) 33.235.0 - 95.147.0 %   MCV 99.1 80.0 - 100.0 fL   MCH 34.1 (H) 26.0 - 34.0 pg   MCHC 34.4 32.0 - 36.0 g/dL   RDW 88.413.4 16.611.5 - 06.314.5 %   Platelets 159 150 - 440 K/uL    Comment: Performed at [facility], [address] [place], [place] [address]     Assessment:   27 y.o. [id number] postoperativeday # 1-stable  Ambulate  Support breast feeding   Plan:  1) Mild acute blood loss anemia - hemodynamically stable and asymptomatic - po vitamins with iron  2) A POS, RI, VI  3) TDAP 05/03/2017  4) Breast/ IUD  5) Disposition-on POD 2 or 3

## 2017-06-11 NOTE — Progress Notes (Signed)
500cc of clear amber urine drained in Foley Cathter. Pt. Is able to sit up on side of bed without c/o. Foley D/C'd as per standing order and pt. Tolerated this well.

## 2017-06-11 NOTE — Anesthesia Postprocedure Evaluation (Signed)
Anesthesia Post Note  Patient: Cynthia Coffey  Procedure(s) Performed: CESAREAN SECTION (N/A )  Patient location during evaluation: Mother Baby Anesthesia Type: Spinal Level of consciousness: awake and alert and oriented Pain management: pain level controlled Vital Signs Assessment: post-procedure vital signs reviewed and stable Respiratory status: spontaneous breathing Cardiovascular status: blood pressure returned to baseline Postop Assessment: no apparent nausea or vomiting and adequate PO intake Anesthetic complications: no     Last Vitals:  Vitals:   06/10/17 2343 06/11/17 0357  BP: 121/76 110/78  Pulse: 70 77  Resp: 18 20  Temp: 36.5 C 36.6 C  SpO2: 100% 100%    Last Pain:  Vitals:   06/11/17 0358  TempSrc:   PainSc: 0-No pain                 [name],  Alessandra BevelsJennifer M

## 2017-06-11 NOTE — Anesthesia Post-op Follow-up Note (Signed)
  Anesthesia Pain Follow-up Note  Patient: Cynthia Coffey  Day #: 1  Date of Follow-up: 06/11/2017 Time: 7:21 AM  Last Vitals:  Vitals:   06/10/17 2343 06/11/17 0357  BP: 121/76 110/78  Pulse: 70 77  Resp: 18 20  Temp: 36.5 C 36.6 C  SpO2: 100% 100%    Level of Consciousness: alert  Pain: mild   Side Effects:None  Catheter Site Exam:clean, dry, no drainage     Plan: D/C from anesthesia care at surgeon's request  Rica MastBachich,  Patrice Moates M

## 2017-06-12 MED ORDER — DOCUSATE SODIUM 100 MG PO CAPS
100.0000 mg | ORAL_CAPSULE | Freq: Two times a day (BID) | ORAL | Status: DC | PRN
  Administered 2017-06-12: 100 mg via ORAL
  Filled 2017-06-12: qty 1

## 2017-06-12 NOTE — Plan of Care (Signed)
Vs stable; up ad lib; ambulating to SCN to see baby; using breast pump; taking motrin for pain control; tolerating a regular diet

## 2017-06-12 NOTE — Lactation Note (Signed)
This note was copied from a baby's chart. Lactation Consultation Note  Patient Name: Cynthia Coffey Today's Date: 06/12/2017   After speaking with Mom about trying to oibtain a breast pump form WIC, [facility] her. The peer counselor was already trying to contact MOm to set things up. I relayed this info to Mom. Meanwhile, she denies having any pump questions or issues. She has been obtaining  At least a few mls each time today.      Maternal Data    Feeding Feeding Type: Donor Breast Milk  LATCH Score                   Interventions    Lactation Tools Discussed/Used     Consult Status      Cynthia Coffey 06/12/2017, 2:35 PM

## 2017-06-12 NOTE — Progress Notes (Signed)
  Subjective:   Post Op Day 2. Patient is sitting in special care nursery and holding baby during her feeding. Patient has had increased pain but is reluctant to take narcotic pain medication. Explained that due to surgery she may need strong medicine for a short time. She said she plans to take some after she visits with her other 2 children this morning. She is ambulating slowly. She is voiding without difficulty. She is tolerating PO intake and she is managing pain with PO medications and On Q pump. Report of leakage of pump from RN and tegaderm was changed.   Objective:  Blood pressure (!) 129/92, pulse (!) 111, temperature 97.9 F (36.6 C), temperature source Oral, resp. rate 20, height 5\' 3"  (1.6 m), weight 185 lb (83.9 kg), last menstrual period 09/20/2016, SpO2 100 %, currently pumping  General: NAD Pulmonary: no increased work of breathing Abdomen: non-distended, non-tender, fundus firm at level of umbilicus Incision: C/D/I Extremities: +1 edema, no erythema, no tenderness   Intake/Output Summary (Last 24 hours) at 06/12/2017 0959 Last data filed at 06/11/2017 1100 Gross per 24 hour  Intake -  Output 250 ml  Net -250 ml    Assessment:   27 y.o. [id number] postoperativeday # 2   Plan:  1) Acute blood loss anemia - hemodynamically stable and asymptomatic - po ferrous sulfate  2) A positive, Rubella Immune, Varicella Immune  3) TDAP status: given antepartum  4) Breast pumping/donor milk/Contraception: IUD  5) Disposition: likely discharge to home tomorrow  Tresea MallJane Maraki Macquarrie, CNM

## 2017-06-13 MED ORDER — OXYCODONE-ACETAMINOPHEN 5-325 MG PO TABS: 1.0000 | ORAL_TABLET | Freq: Four times a day (QID) | ORAL | 0 refills | Status: DC | PRN

## 2017-06-13 MED ORDER — ALUM & MAG HYDROXIDE-SIMETH 200-200-20 MG/5ML PO SUSP
15.0000 mL | ORAL | Status: DC | PRN
  Administered 2017-06-13: 15 mL via ORAL
  Filled 2017-06-13: qty 30

## 2017-06-13 MED ORDER — FAMOTIDINE 20 MG PO TABS
20.0000 mg | ORAL_TABLET | Freq: Every day | ORAL | Status: DC
  Administered 2017-06-13: 20 mg via ORAL
  Filled 2017-06-13: qty 1

## 2017-06-13 NOTE — Progress Notes (Signed)
Patient discharged home with significant other. Discharge instructions and prescriptions given (paper prescription for Percocet given) and reviewed with patient. Borrowing pump from lactation. Engorgement better and expressing milk and educated to pump every 2-3 hour. Patient verbalized understanding.

## 2017-06-13 NOTE — Lactation Note (Signed)
This note was copied from a baby's chart. Lactation Consultation Note  Patient Name: Cynthia Coffey ZOXWR'UToday's Date: 06/13/2017   This LC has worked with this mom and her severe engorgement since this morning. Apparently she was using heat on her breasts and flanges 30 mm when her nipple size is more for a 24 mm flange. She stated this morning that she had not been able to get any milk out all night. I explained how heat and the wrong sized flanges and some other factors may contribute to that problem. We switched her to cold packs alternating with cold cabbage leaves and lots of hand expression to start the flow. It was quite painful to mom which made her not comply with instructions at first until I explained how things could be worse if we don't find a way to get milk flowing. She agreed and allowed this [facility] and another RN to help a few times today with hand expression. Once we got the milk to start to flow, we switched to breast pump with 24 mm flanges and had mom adjust suction as needed. She was ultimately able to express at least 10 ml which she said did help her breasts "feel better". I encouraged her to maintain the same plan at home. [facility] so she could pick up a pump today, but that did not happen. [facility] Serafina Mitchellsmari tells me they are holding a pump for her. I am allowing mom to borrow a Lactina pump of ours until [facility]. I encouraged her  [facility], especially if her breasts are not better by then.      Maternal Data    Feeding Feeding Type: Donor Breast Milk(colostrum from mother added)  LATCH Score                   Interventions    Lactation Tools Discussed/Used     Consult Status      Sunday CornSandra Clark Krist Rosenboom 06/13/2017, 5:30 PM

## 2017-06-13 NOTE — Plan of Care (Signed)
Vs stable; up ad lib; does ambulate to SCN to see/hold baby; tolerating regular diet; taking motrin and roxicodone for pain control; please continue with simethicone, colace and sennacot; pumping; tender breasts and nipples; "it feels like I'm engorged"; another RN on night shift is LC and she was consulted; will try large sized flanges at next breast pump

## 2017-06-16 ENCOUNTER — Ambulatory Visit: Payer: Self-pay

## 2017-06-16 NOTE — Lactation Note (Signed)
This note was copied from a baby's chart. Lactation Consultation Note  Patient Name: Cynthia Coffey WGNFA'OToday's Date: 06/16/2017   Observed mom pumping pink tinged mature breast milk.  Mom had been engorged for last 24 to 48 hours but much better now.  Nipples are dry with slight cracking which could account for pinkish expressed milk.  Mom is using Lactina DEBP that [facility].  She returned our loaner Lactina pump 06/15/2017.  Flange size has been increased to #27.  Comfort gels given with instructions in use.  Instructed mom to contact lactation if milk was more bright red or nipples painful.  Encouraged mom to contact lactation with any questions, concerns or assistance.  Maternal Data    Feeding    LATCH Score                   Interventions    Lactation Tools Discussed/Used     Consult Status      Louis MeckelWilliams, [name] 06/16/2017, 3:59 PM

## 2017-06-20 ENCOUNTER — Ambulatory Visit (INDEPENDENT_AMBULATORY_CARE_PROVIDER_SITE_OTHER): Payer: Medicaid Other | Admitting: Obstetrics and Gynecology

## 2017-06-20 ENCOUNTER — Encounter: Payer: Self-pay | Admitting: Obstetrics and Gynecology

## 2017-06-20 DIAGNOSIS — Z98891 History of uterine scar from previous surgery: Secondary | ICD-10-CM

## 2017-06-20 NOTE — Progress Notes (Signed)
  OBSTETRICS POSTPARTUM CLINIC PROGRESS NOTE  Subjective:     Cynthia Coffey is a 27 y.o. [id number] female who presents for a postpartum visit. She is 1 week postpartum following a Pregnancy complicated by: IUGR and delivery by C-section.  I have fully reviewed the prenatal and intrapartum course. Anesthesia: spinal.  Postpartum course has been complicated by uncomplicated.  Baby is feeding by Breast.  Bleeding: patient has not  resumed menses.  Bowel function is abnormal: constipation and now diarrhea. Bladder function is normal.  Patient is not sexually active. Contraception method desired is IUD.  Postpartum depression screening: negative, patient reports that her mood is good.  She reports that she has been over exerting herself and not sleeping . Yesterday she felt awful with sharp pains in her breast and diarrhea, she ended up sleeping for a long time and has felt better. Her infant is doing well and home from the NICU.   The following portions of the patient's history were reviewed and updated as appropriate: allergies, current medications, past family history, past medical history, past social history, past surgical history and problem list.  Review of Systems Pertinent items are noted in HPI.  Objective:    BP 120/60   Pulse (!) 110   Temp 98.4 F (36.9 C)   Ht 5\' 4"  (1.626 m)   Wt 169 lb (76.7 kg)   LMP 09/20/2016 (Approximate)   BMI 29.01 kg/m   General:  alert and no distress   Breasts:  inspection negative, no nipple discharge or bleeding, no masses or nodularity palpable  Lungs: clear to auscultation bilaterally  Heart:  regular rate and rhythm, S1, S2 normal, no murmur, click, rub or gallop  Abdomen: soft, non-tender; bowel sounds normal; no masses,  no organomegaly.  Well healed Pfannenstiel incision   Vulva:  normal  Vagina: normal vagina, no discharge, exudate, lesion, or erythema  Cervix:  no cervical motion tenderness and no lesions  Corpus: normal size,  contour, position, consistency, mobility, non-tender  Adnexa:  normal adnexa and no mass, fullness, tenderness  Rectal Exam: Not performed.          Assessment:  Post Partum Care visit 1. Postpartum care and examination 2. S/P cesarean section  Plan:  See orders and Patient Instructions Follow up in: 5 weeks or as needed.  Patient desires IUD at 6 week postpartum visit. Natale Milchhristanna R Georgette Helmer MD [facility], [facility] 06/20/2017  11:58 AM

## 2017-06-21 ENCOUNTER — Other Ambulatory Visit: Payer: Self-pay

## 2017-06-21 ENCOUNTER — Emergency Department
Admission: EM | Admit: 2017-06-21 | Discharge: 2017-06-21 | Disposition: A | Discharge status: Home / Self Care | Payer: Medicaid Other | Attending: Emergency Medicine | Admitting: Emergency Medicine

## 2017-06-21 DIAGNOSIS — Z5321 Procedure and treatment not carried out due to patient leaving prior to being seen by health care provider: Secondary | ICD-10-CM | POA: Diagnosis not present

## 2017-06-21 DIAGNOSIS — R509 Fever, unspecified: Secondary | ICD-10-CM | POA: Insufficient documentation

## 2017-06-21 DIAGNOSIS — R197 Diarrhea, unspecified: Secondary | ICD-10-CM | POA: Diagnosis not present

## 2017-06-21 DIAGNOSIS — R109 Unspecified abdominal pain: Secondary | ICD-10-CM | POA: Insufficient documentation

## 2017-06-21 LAB — COMPREHENSIVE METABOLIC PANEL
ALBUMIN: 3.1 g/dL — AB (ref 3.5–5.0)
ALT: 29 U/L (ref 14–54)
AST: 18 U/L (ref 15–41)
Alkaline Phosphatase: 87 U/L (ref 38–126)
Anion gap: 8 (ref 5–15)
BUN: 13 mg/dL (ref 6–20)
CHLORIDE: 106 mmol/L (ref 101–111)
CO2: 24 mmol/L (ref 22–32)
Calcium: 8.7 mg/dL — ABNORMAL LOW (ref 8.9–10.3)
Creatinine, Ser: 0.75 mg/dL (ref 0.44–1.00)
GFR calc Af Amer: >60 mL/min (ref >60)
GFR calc non Af Amer: >60 mL/min (ref >60)
Glucose, Bld: 129 mg/dL — ABNORMAL HIGH (ref 65–99)
Potassium: 3.3 mmol/L — ABNORMAL LOW (ref 3.5–5.1)
SODIUM: 138 mmol/L (ref 135–145)
Total Bilirubin: 0.2 mg/dL — ABNORMAL LOW (ref 0.3–1.2)
Total Protein: 6.7 g/dL (ref 6.5–8.1)

## 2017-06-21 LAB — CBC
HEMATOCRIT: 34.7 % — AB (ref 35.0–47.0)
Hemoglobin: 11.8 g/dL — ABNORMAL LOW (ref 12.0–16.0)
MCH: 33.6 pg (ref 26.0–34.0)
MCHC: 34.1 g/dL (ref 32.0–36.0)
MCV: 98.8 fL (ref 80.0–100.0)
Platelets: 237 10*3/uL (ref 150–440)
RBC: 3.52 MIL/uL — ABNORMAL LOW (ref 3.80–5.20)
RDW: 13.1 % (ref 11.5–14.5)
WBC: 9.5 10*3/uL (ref 3.6–11.0)

## 2017-06-21 LAB — URINALYSIS, COMPLETE (UACMP) WITH MICROSCOPIC
BACTERIA UA: NONE SEEN
Bilirubin Urine: NEGATIVE
Glucose, UA: NEGATIVE mg/dL
KETONES UR: 5 mg/dL — AB
Leukocytes, UA: NEGATIVE
Nitrite: NEGATIVE
PH: 5 (ref 5.0–8.0)
Protein, ur: 100 mg/dL — AB
Specific Gravity, Urine: 1.032 — ABNORMAL HIGH (ref 1.005–1.030)

## 2017-06-21 LAB — POCT PREGNANCY, URINE: PREG TEST UR: NEGATIVE

## 2017-06-21 LAB — LIPASE, BLOOD: LIPASE: 30 U/L (ref 11–51)

## 2017-06-21 NOTE — ED Triage Notes (Addendum)
Pt arrives to ED via POV from home with c/o fever, body aches, abdominal pain, and diarrhea x3 days. Pt is 11 days postpartum following vaginal delivery and is currently breastfeeding. Pt did not actually check temp at home d/t not having a thermometer. Pt reports abdominal pain when having a BM, but states she is not straining. Pt reports "light bleeding" since giving birth, but no heavy bleeding or clots noted.  Pt reports last dose of Ibuprofen taken 2-3 hrs PTA (no Tylenol taken).

## 2017-06-21 NOTE — ED Notes (Signed)
Pt ambulatory to STAT desk without difficulty or distress noted; pt upset over wait time; pt updated on such, and st "leaving"

## 2017-06-22 ENCOUNTER — Telehealth: Payer: Self-pay

## 2017-06-22 ENCOUNTER — Telehealth: Payer: Self-pay | Admitting: Emergency Medicine

## 2017-06-22 NOTE — Telephone Encounter (Signed)
Per SDJ have pt come for eval today w/[facility]. Notified pt who states she is too busy today & asked for apt for Monday. Advised if she has continued fever or worsening symptoms she should report to ER over the weekend. Mondays schedules are booked. Huntley DecSara to discuss with providers and contact pt with availibility.

## 2017-06-22 NOTE — Telephone Encounter (Signed)
[facility]

## 2017-06-22 NOTE — Telephone Encounter (Signed)
Called patient due to lwot to inquire about condition and follow up plans. Says she is still dosing self with tylenol for fever.  Says still not feeling well.  Says baby is doing fine.  I asked her to call [facility] to inform them of her condition and have them review her labs. She agrees.

## 2017-06-22 NOTE — Telephone Encounter (Signed)
Pt calling per recommendations of ER for Dr. Jerene PitchSchuman to discuss/review labs w/patient. She went back to ER w/Fever that's been going on for 4 days now. ZO#[phone]

## 2017-06-25 ENCOUNTER — Ambulatory Visit (INDEPENDENT_AMBULATORY_CARE_PROVIDER_SITE_OTHER): Payer: Medicaid Other | Admitting: Obstetrics & Gynecology

## 2017-06-25 ENCOUNTER — Encounter: Payer: Self-pay | Admitting: Obstetrics & Gynecology

## 2017-06-25 VITALS — BP 120/80 | HR 98 | Ht 63.0 in | Wt 168.0 lb

## 2017-06-25 DIAGNOSIS — O864 Pyrexia of unknown origin following delivery: Secondary | ICD-10-CM | POA: Diagnosis not present

## 2017-06-25 DIAGNOSIS — N3000 Acute cystitis without hematuria: Secondary | ICD-10-CM | POA: Diagnosis not present

## 2017-06-25 MED ORDER — CEPHALEXIN 500 MG PO CAPS: 500.0000 mg | ORAL_CAPSULE | Freq: Four times a day (QID) | ORAL | 2 refills | Status: DC

## 2017-06-25 NOTE — Progress Notes (Signed)
HPI:      [name] is a 27 y.o. [phone] who LMP was Patient's last menstrual period was 09/20/2016 (approximate)., presents today for a problem visit.    Urinary Tract Infection: Patient complains of fever . She has had symptoms for 4 days. Patient also complains of swelling around CS incision. Denies calf pain, SOB, congestion, nausea.  Some breast engorgement.  No dysuria.. Patient denies headache, sorethroat and stomach ache. Patient does not have a history of recurrent UTI.  Patient does not have a history of pyelonephritis.   PMHx: She  has a past medical history of Medical history non-contributory. Also,  has a past surgical history that includes No past surgeries and Cesarean section (N/A, 06/10/2017)., family history includes Colon cancer in her mother; Diabetes in her paternal grandfather; Throat cancer in her paternal grandmother.,  reports that she quit smoking about 8 months ago. Her smoking use included cigarettes. She smoked 0.25 packs per day. She has never used smokeless tobacco. She reports that she has current or past drug history. Drug: Marijuana. She reports that she does not drink alcohol.  She has a current medication list which includes the following prescription(s): acetaminophen, ibuprofen, prenatal vit-fe fumarate-fa, cephalexin, and oxycodone-acetaminophen. Also, has No Known Allergies.  Review of Systems  Constitutional: Negative for chills, fever and malaise/fatigue.  HENT: Negative for congestion, sinus pain and sore throat.   Eyes: Negative for blurred vision and pain.  Respiratory: Negative for cough and wheezing.   Cardiovascular: Negative for chest pain and leg swelling.  Gastrointestinal: Negative for abdominal pain, constipation, diarrhea, heartburn, nausea and vomiting.  Genitourinary: Negative for dysuria, frequency, hematuria and urgency.  Musculoskeletal: Negative for back pain, joint pain, myalgias and neck pain.  Skin: Negative for itching  and rash.  Neurological: Negative for dizziness, tremors and weakness.  Endo/Heme/Allergies: Does not bruise/bleed easily.  Psychiatric/Behavioral: Negative for depression. The patient is not nervous/anxious and does not have insomnia.     Objective: BP 120/80   Pulse 98   Ht  (1.6 m)   Wt 168 lb (76.2 kg)   LMP 09/20/2016 (Approximate)   BMI 29.76 kg/m  Physical Exam  Constitutional: She is oriented to person, place, and time. She appears well-developed and well-nourished. No distress.  Genitourinary: Rectum normal, vagina normal and uterus normal. Pelvic exam was performed with patient supine. There is no rash or lesion on the right labia. There is no rash or lesion on the left labia. Vagina exhibits no lesion. No bleeding in the vagina. Right adnexum does not display mass and does not display tenderness. Left adnexum does not display mass and does not display tenderness. Cervix does not exhibit motion tenderness, lesion, friability or polyp.   Uterus is mobile and midaxial. Uterus is not enlarged or exhibiting a mass.  HENT:  Head: Normocephalic and atraumatic. Head is without laceration.  Right Ear: Hearing normal.  Left Ear: Hearing normal.  Nose: No epistaxis.  No foreign bodies.  Mouth/Throat: Uvula is midline, oropharynx is clear and moist and mucous membranes are normal.  Eyes: Pupils are equal, round, and reactive to light.  Neck: Normal range of motion. Neck supple. No thyromegaly present.  Cardiovascular: Normal rate and regular rhythm. Exam reveals no gallop and no friction rub.  No murmur heard. Pulmonary/Chest: Effort normal and breath sounds normal. No respiratory distress. She has no wheezes. Right breast exhibits no mass, no skin change and no tenderness. Left breast exhibits no mass, no skin change and  no tenderness.  Engorgement and some erythema noted No mass No LAN  Abdominal: Soft. Bowel sounds are normal. She exhibits no distension. There is no  tenderness. There is no rebound.  Inc healing well and without erythema or drainage  Musculoskeletal: Normal range of motion.  No calf T, neg Homans  Neurological: She is alert and oriented to person, place, and time. No cranial nerve deficit.  Skin: Skin is warm and dry.  Psychiatric: She has a normal mood and affect. Judgment normal.  Vitals reviewed.  ASSESSMENT/PLAN:   Acute cystitis and also possible mastitis Keflex Heat Pumping Monitor for urinary sx's Inc healing well  Annamarie Major, MD, [facility], [facility] 06/25/2017  3:51 PM

## 2017-06-25 NOTE — Patient Instructions (Signed)
Cephalexin tablets or capsules What is this medicine? CEPHALEXIN (sef a LEX in) is a cephalosporin antibiotic. It is used to treat certain kinds of bacterial infections It will not work for colds, flu, or other viral infections. This medicine may be used for other purposes; ask your health care provider or pharmacist if you have questions. COMMON BRAND NAME(S): Biocef, Daxbia, Keflex, Keftab What should I tell my health care provider before I take this medicine? They need to know if you have any of these conditions: -kidney disease -stomach or intestine problems, especially colitis -an unusual or allergic reaction to cephalexin, other cephalosporins, penicillins, other antibiotics, medicines, foods, dyes or preservatives -pregnant or trying to get pregnant -breast-feeding How should I use this medicine? Take this medicine by mouth with a full glass of water. Follow the directions on the prescription label. This medicine can be taken with or without food. Take your medicine at regular intervals. Do not take your medicine more often than directed. Take all of your medicine as directed even if you think you are better. Do not skip doses or stop your medicine early. Talk to your pediatrician regarding the use of this medicine in children. While this drug may be prescribed for selected conditions, precautions do apply. Overdosage: If you think you have taken too much of this medicine contact a poison control center or emergency room at once. NOTE: This medicine is only for you. Do not share this medicine with others. What if I miss a dose? If you miss a dose, take it as soon as you can. If it is almost time for your next dose, take only that dose. Do not take double or extra doses. There should be at least 4 to 6 hours between doses. What may interact with this medicine? -probenecid -some other antibiotics This list may not describe all possible interactions. Give your health care provider a list of  all the medicines, herbs, non-prescription drugs, or dietary supplements you use. Also tell them if you smoke, drink alcohol, or use illegal drugs. Some items may interact with your medicine. What should I watch for while using this medicine? Tell your doctor or health care professional if your symptoms do not begin to improve in a few days. Do not treat diarrhea with over the counter products. Contact your doctor if you have diarrhea that lasts more than 2 days or if it is severe and watery. If you have diabetes, you may get a false-positive result for sugar in your urine. Check with your doctor or health care professional. What side effects may I notice from receiving this medicine? Side effects that you should report to your doctor or health care professional as soon as possible: -allergic reactions like skin rash, itching or hives, swelling of the face, lips, or tongue -breathing problems -pain or trouble passing urine -redness, blistering, peeling or loosening of the skin, including inside the mouth -severe or watery diarrhea -unusually weak or tired -yellowing of the eyes, skin Side effects that usually do not require medical attention (report to your doctor or health care professional if they continue or are bothersome): -gas or heartburn -genital or anal irritation -headache -joint or muscle pain -nausea, vomiting This list may not describe all possible side effects. Call your doctor for medical advice about side effects. You may report side effects to FDA at 1-800-FDA-1088. Where should I keep my medicine? Keep out of the reach of children. Store at room temperature between 59 and 86 degrees F (15 and   30 degrees C). Throw away any unused medicine after the expiration date. NOTE: This sheet is a summary. It may not cover all possible information. If you have questions about this medicine, talk to your doctor, pharmacist, or health care provider.  2018 Elsevier/Gold Standard  (2007-05-20 17:09:13)  

## 2017-07-25 ENCOUNTER — Telehealth: Payer: Self-pay

## 2017-07-25 ENCOUNTER — Encounter: Payer: Self-pay | Admitting: Obstetrics and Gynecology

## 2017-07-25 ENCOUNTER — Ambulatory Visit (INDEPENDENT_AMBULATORY_CARE_PROVIDER_SITE_OTHER): Payer: Medicaid Other | Admitting: Obstetrics and Gynecology

## 2017-07-25 VITALS — BP 114/80 | HR 62 | Ht 63.0 in | Wt 165.0 lb

## 2017-07-25 DIAGNOSIS — Z30430 Encounter for insertion of intrauterine contraceptive device: Secondary | ICD-10-CM

## 2017-07-25 DIAGNOSIS — Z124 Encounter for screening for malignant neoplasm of cervix: Secondary | ICD-10-CM

## 2017-07-25 DIAGNOSIS — O99345 Other mental disorders complicating the puerperium: Secondary | ICD-10-CM

## 2017-07-25 DIAGNOSIS — F418 Other specified anxiety disorders: Secondary | ICD-10-CM

## 2017-07-25 MED ORDER — CITALOPRAM HYDROBROMIDE 20 MG PO TABS: 20.0000 mg | ORAL_TABLET | Freq: Every day | ORAL | 2 refills | Status: DC

## 2017-07-25 NOTE — Progress Notes (Signed)
Postpartum Visit  Chief Complaint:  Chief Complaint  Patient presents with  . Routine postpartum    IUD insertion    History of Present Illness: Patient is a 27 y.o. Cynthia Coffey presents for postpartum visit.  Date of delivery:  Cesarean Section: Fetal intolerance to labor Pregnancy or labor problems:  Yes, IUGR Any problems since the delivery:  Yes , anxiety  Newborn Details:  SINGLETON :  1. BabyGender female. Birth weight: 5lbs 10oz Maternal Details:  Breast or formula feeding: bottle feeding Any bowel or bladder issues: No  Post partum depression/anxiety noted:  no Edinburgh Post-Partum Depression Score: 7 Date of last PAP: 12/20/2016  ASCUS with POSITIVE high risk HPV   Review of Systems: Review of Systems  Constitutional: Negative.   Gastrointestinal: Negative.   Psychiatric/Behavioral: Negative for depression, hallucinations, memory loss, substance abuse and suicidal ideas. The patient is nervous/anxious and has insomnia.     The following portions of the patient's history were reviewed and updated as appropriate: allergies, current medications, past family history, past medical history, past social history, past surgical history and problem list.  Past Medical History:  Past Medical History:  Diagnosis Date  . Medical history non-contributory     Past Surgical History:  Past Surgical History:  Procedure Laterality Date  . CESAREAN SECTION N/A 06/10/2017   Procedure: CESAREAN SECTION;  Surgeon: Vena Austria, MD;  Location: [facility] ORS;  Service: Obstetrics;  Laterality: N/A;  . NO PAST SURGERIES      Family History:  Family History  Problem Relation Age of Onset  . Diabetes Paternal Grandfather   . Colon cancer Mother   . Throat cancer Paternal Grandmother     Social History:  Social History   Socioeconomic History  . Marital status: Single    Spouse name: Not on file  . Number of children: Not on file  . Years of education: Not on file  .  Highest education level: Not on file  Occupational History  . Not on file  Social Needs  . Financial resource strain: Not on file  . Food insecurity:    Worry: Not on file    Inability: Not on file  . Transportation needs:    Medical: Not on file    Non-medical: Not on file  Tobacco Use  . Smoking status: Former Smoker    Packs/day: 0.25    Types: Cigarettes    Last attempt to quit: 10/10/2016    Years since quitting: 0.7  . Smokeless tobacco: Never Used  Substance and Sexual Activity  . Alcohol use: No  . Drug use: Not Currently    Types: Marijuana    Comment: no use since 20 weeks  . Sexual activity: Yes    Comment: unsure  Lifestyle  . Physical activity:    Days per week: Not on file    Minutes per session: Not on file  . Stress: Not on file  Relationships  . Social connections:    Talks on phone: Not on file    Gets together: Not on file    Attends religious service: Not on file    Active member of club or organization: Not on file    Attends meetings of clubs or organizations: Not on file    Relationship status: Not on file  . Intimate partner violence:    Fear of current or ex partner: Not on file    Emotionally abused: Not on file    Physically abused: Not on  file    Forced sexual activity: Not on file  Other Topics Concern  . Not on file  Social History Narrative  . Not on file    Allergies:  No Known Allergies  Medications: Prior to Admission medications   Medication Sig Start Date End Date Taking? Authorizing Provider  acetaminophen (TYLENOL) 500 MG tablet Take 500 mg by mouth every 6 (six) hours as needed.    [provider]  cephALEXin (KEFLEX) 500 MG capsule Take 1 capsule (500 mg total) by mouth 4 (four) times daily. 06/25/17   Nadara Mustard, MD  ibuprofen (ADVIL,MOTRIN) 400 MG tablet Take 400 mg by mouth every 6 (six) hours as needed.    [provider]  oxyCODONE-acetaminophen (PERCOCET/ROXICET) 5-325 MG tablet Take 1-2  tablets by mouth every 6 (six) hours as needed. Patient not taking: Reported on 06/20/2017 06/13/17   Oswaldo Conroy, CNM  Prenatal Vit-Fe Fumarate-FA (PRENATAL VITAMIN PO) Take by mouth.    [provider]    Physical Exam Blood pressure 114/80, pulse 62, height  (1.6 m), weight 165 lb (74.8 kg), last menstrual period 07/24/2017, not currently breastfeeding.  General: NAD HEENT: normocephalic, anicteric Pulmonary: No increased work of breathing Abdomen: NABS, soft, non-tender, non-distended.  Umbilicus without lesions.  No hepatomegaly, splenomegaly or masses palpable. No evidence of hernia. Incision well healed Genitourinary:  External: Normal external female genitalia.  Normal urethral meatus, normal  Bartholin's and Skene's glands.    Vagina: Normal vaginal mucosa, no evidence of prolapse.    Cervix: Grossly normal in appearance, no bleeding  Uterus: Non-enlarged, mobile, normal contour.  No CMT  Adnexa: ovaries non-enlarged, no adnexal masses  Rectal: deferred Extremities: no edema, erythema, or tenderness Neurologic: Grossly intact Psychiatric: mood appropriate, affect full  Edinburgh Postnatal Depression Scale - 07/25/17 1112      Edinburgh Postnatal Depression Scale:  In the Past 7 Days   I have been able to laugh and see the funny side of things.  0    I have looked forward with enjoyment to things.  0    I have blamed myself unnecessarily when things went wrong.  1    Things have been getting on top of me.  0    I have been so unhappy that I have had difficulty sleeping.  0    I have felt sad or miserable.  0    I have been so unhappy that I have been crying.  0    The thought of harming myself has occurred to me.  0       GYNECOLOGY OFFICE PROCEDURE NOTE  Cynthia Coffey is a 27 y.o. [phone] here for a Mirena IUD insertion. No GYN concerns. The indication for her IUD is contraception/cycle control.  IUD Insertion Procedure Note Patient  identified, informed consent performed, consent signed.   Discussed risks of irregular bleeding, cramping, infection, malpositioning, expulsion or uterine perforation of the IUD (1:1000 placements)  which may require further procedure such as laparoscopy.  IUD while effective at preventing pregnancy do not prevent transmission of sexually transmitted diseases and use of barrier methods for this purpose was discussed. Time out was performed.  Urine pregnancy test negative.  Speculum placed in the vagina.  Cervix visualized.  Cleaned with Betadine x 2.  Grasped anteriorly with a single tooth tenaculum.  Uterus sounded to 10 cm. IUD placed per manufacturer's recommendations.  Strings trimmed to 3 cm. Tenaculum was removed, good hemostasis noted.  Patient tolerated  procedure well.   Patient was given post-procedure instructions.  She was advised to have backup contraception for one week.  Patient was also asked to check IUD strings periodically and follow up in 6 weeks for IUD check.  Assessment: 27 y.o. Cynthia Coffey presenting for 6 week postpartum visit  Plan: Problem List Items Addressed This Visit    None    Visit Diagnoses    Cervical cancer screening    -  Primary   Relevant Orders   PapIG, HPV, rfx 16/18   Encounter for postpartum visit       Encounter for IUD insertion          1) Contraception - Education given regarding options for contraception, as well as compatibility with breast feeding if applicable.  Patient plans on IUD for contraception.  2)  Pap - ASCCP guidelines and rational discussed.  ASCCP guidelines and rational discussed.  Patient opts for every 3 years screening interval  3) Patient underwent screening for postpartum depression with anxiety noted, rx citalopram  4) Return in about 1 month (around 08/22/2017) for IUD string check medication follow up.   Vena Austria, MD, [facility], [facility] 07/25/2017, 11:43 AM

## 2017-07-25 NOTE — Telephone Encounter (Signed)
Pt called triage today, has questions about medication. (im guessing Celexa from todays visit with AMS) lm with pt to call back with more info so I can help or send msg to [facility].

## 2017-07-31 LAB — PAPIG, HPV, RFX 16/18
HPV, HIGH-RISK: POSITIVE — AB
PAP Smear Comment: 0

## 2017-08-23 NOTE — Progress Notes (Deleted)
Obstetrics & Gynecology Office Visit   Chief Complaint: No chief complaint on file.   History of Present Illness: The patient is a 27 y.o. female presenting follow up for symptoms of {Blank single:19197::"depression","anxiety","anxiety and depression"}.  The patient is currently taking citalopram 20mg  for the management of her symptoms.  She {HAS HAS WUJ:81191}OT:18834} had any recent situational stressors.  She reports symptoms of {Blank multiple:19196::"anhedonia","day time somnolence","insomnia","risk taking behavior","irritability","increased appetite","decreased appetite","social anxiety","agorophobia","feelings of guilt","feelings of worthlessness","suicidal ideation","homicidal ideation","auditory hallucinations","visual hallucinations"}.  She denies {Blank multiple:19196::"anhedonia","day time somnolence","insomnia","risk taking behavior","irritability","increased appetite","decreased appetite","social anxiety","agorophobia","feelings of guilt","feelings of worthlessness","suicidal ideation","homicidal ideation","auditory hallucinations","visual hallucinations"}. Symptoms have {Blank single:19197::"improved","remained unchanged","worsened"} since {Blank single:19197::""initial onset ***","last visit"}.      27 y.o. patient presenting for follow up of {Blank single:19197::"Mirena","Liletta","Paragard","Kylena"} IUD placement {1-6 weeks:20341} ago.  The indication for her IUD was {Blank single:19197::"contraception","cycle control","endometriosis"}.  She {Actions; denies/reports/admits to:19208} any complications since her IUD placement.  Still having some occasional spotting.  {ACTION; IS/IS [id number]} able to feel strings.     Review of Systems: ***  Past Medical History:  Past Medical History:  Diagnosis Date  . Medical history non-contributory     Past Surgical History:  Past Surgical History:  Procedure Laterality Date  . CESAREAN SECTION N/A 06/10/2017   Procedure: CESAREAN  SECTION;  Surgeon: Vena AustriaStaebler, Jonovan Boedecker, MD;  Location: [facility] ORS;  Service: Obstetrics;  Laterality: N/A;  . NO PAST SURGERIES      Gynecologic History: No LMP recorded.  Obstetric History: [id number]  Family History:  Family History  Problem Relation Age of Onset  . Diabetes Paternal Grandfather   . Colon cancer Mother   . Throat cancer Paternal Grandmother     Social History:  Social History   Socioeconomic History  . Marital status: Single    Spouse name: Not on file  . Number of children: Not on file  . Years of education: Not on file  . Highest education level: Not on file  Occupational History  . Not on file  Social Needs  . Financial resource strain: Not on file  . Food insecurity:    Worry: Not on file    Inability: Not on file  . Transportation needs:    Medical: Not on file    Non-medical: Not on file  Tobacco Use  . Smoking status: Former Smoker    Packs/day: 0.25    Types: Cigarettes    Last attempt to quit: 10/10/2016    Years since quitting: 0.8  . Smokeless tobacco: Never Used  Substance and Sexual Activity  . Alcohol use: No  . Drug use: Not Currently    Types: Marijuana    Comment: no use since 20 weeks  . Sexual activity: Yes    Comment: unsure  Lifestyle  . Physical activity:    Days per week: Not on file    Minutes per session: Not on file  . Stress: Not on file  Relationships  . Social connections:    Talks on phone: Not on file    Gets together: Not on file    Attends religious service: Not on file    Active member of club or organization: Not on file    Attends meetings of clubs or organizations: Not on file    Relationship status: Not on file  . Intimate partner violence:    Fear of current or ex partner: Not on file    Emotionally abused: Not on file    Physically abused: Not on file  Forced sexual activity: Not on file  Other Topics Concern  . Not on file  Social History Narrative  . Not on file    Allergies:  No Known  Allergies  Medications: Prior to Admission medications   Medication Sig Start Date End Date Taking? Authorizing Provider  acetaminophen (TYLENOL) 500 MG tablet Take 500 mg by mouth every 6 (six) hours as needed.    [provider]  cephALEXin (KEFLEX) 500 MG capsule Take 1 capsule (500 mg total) by mouth 4 (four) times daily. 06/25/17   Nadara Mustard, MD  citalopram (CELEXA) 20 MG tablet Take 1 tablet (20 mg total) by mouth daily. 07/25/17   Vena Austria, MD  ibuprofen (ADVIL,MOTRIN) 400 MG tablet Take 400 mg by mouth every 6 (six) hours as needed.    [provider]  oxyCODONE-acetaminophen (PERCOCET/ROXICET) 5-325 MG tablet Take 1-2 tablets by mouth every 6 (six) hours as needed. Patient not taking: Reported on 06/20/2017 06/13/17   Oswaldo Conroy, CNM  Prenatal Vit-Fe Fumarate-FA (PRENATAL VITAMIN PO) Take by mouth.    [provider]    Physical Exam Vitals: There were no vitals filed for this visit. No LMP recorded.  General: NAD HEENT: normocephalic, anicteric Pulmonary: No increased work of breathing Neurologic: Grossly intact Psychiatric: mood appropriate, affect full  Edinburgh Postnatal Depression Scale - 07/25/17 1112      Edinburgh Postnatal Depression Scale:  In the Past 7 Days   I have been able to laugh and see the funny side of things.  0    I have looked forward with enjoyment to things.  0    I have blamed myself unnecessarily when things went wrong.  1    Things have been getting on top of me.  0    I have been so unhappy that I have had difficulty sleeping.  0    I have felt sad or miserable.  0    I have been so unhappy that I have been crying.  0    The thought of harming myself has occurred to me.  0       No flowsheet data found.  No flowsheet data found.  No flowsheet data found.    GYNECOLOGY CLINIC COLPOSCOPY PROCEDURE NOTE  27 y.o. [name] here for colposcopy for low-grade squamous intraepithelial neoplasia  (LGSIL - encompassing HPV,mild dysplasia,CIN I)  pap smear on 07/25/2017. Discussed underlying role for HPV infection in the development of cervical dysplasia, its natural history and progression/regression, need for surveillance.  Is the patient  pregnant: {yes/no:20286} LMP: No LMP recorded. Smoking status:  reports that she quit smoking about 10 months ago. Her smoking use included cigarettes. She smoked 0.25 packs per day. She has never used smokeless tobacco. Contraception: {method:5051} Number current sexual partners:  *** Number of partners in lifetime:  *** High risk partner:{yes/no:20286} History of STD:  {yes/no:20286} Future fertility desired:  {yes/no:20286}  Patient given informed consent, signed copy in the chart, time out was performed.  The patient was position in dorsal lithotomy position. Speculum was placed the cervix was visualized.   After application of acetic acid colposcopic inspection of the cervix was undertaken.   Colposcopy adequate, full visualization of transformation zone: {yes/no:20286} {Findings; colposcopy:728}; corresponding biopsies obtained.   ECC specimen obtained:  {yes/no:20286}  All specimens were labeled and sent to pathology.   Patient was given post procedure instructions.  Will follow up pathology and manage accordingly.  Routine preventative health maintenance measures emphasized.  OBGyn Exam  Assessment: 27 y.o. [name]***  Plan: Problem List Items Addressed This Visit    None      1) ***  2) Thyroid and B12 screen {HAS/HAS NOT:20194} been obtained previously  1.  The patient was given instructions to check her IUD strings monthly and call with any problems or concerns.  She should call for fevers, chills, abnormal vaginal discharge, pelvic pain, or other complaints.  2.   IUDs while effective at preventing pregnancy do not prevent transmission of sexually transmitted diseases and use of barrier methods for this purpose was  discussed.  Low overall incidence of failure with 99.7% efficacy rate in typical use.  The patient has not contraindication to IUD placement.  3.  She will return for a annual exam in 1 year.  All questions answered.  4) A total of 15 minutes were spent in face-to-face contact with the patient during this encounter with over half of that time devoted to counseling and coordination of care.  5) No follow-ups on file.    Vena Austria, MD, [facility], [facility] 08/23/2017, 9:10 PM

## 2017-08-24 ENCOUNTER — Ambulatory Visit: Payer: Medicaid Other | Admitting: Obstetrics and Gynecology

## 2017-09-06 ENCOUNTER — Ambulatory Visit: Payer: Medicaid Other | Admitting: Obstetrics and Gynecology

## 2017-10-25 ENCOUNTER — Ambulatory Visit: Payer: Medicaid Other | Admitting: Obstetrics and Gynecology

## 2018-11-22 ENCOUNTER — Emergency Department (HOSPITAL_COMMUNITY): Payer: Medicaid Other

## 2018-11-22 ENCOUNTER — Other Ambulatory Visit: Payer: Self-pay

## 2018-11-22 ENCOUNTER — Emergency Department (HOSPITAL_COMMUNITY)
Admission: EM | Admit: 2018-11-22 | Discharge: 2018-11-23 | Disposition: A | Discharge status: Home / Self Care | Payer: Medicaid Other | Attending: Emergency Medicine | Admitting: Emergency Medicine

## 2018-11-22 ENCOUNTER — Encounter (HOSPITAL_COMMUNITY): Payer: Self-pay

## 2018-11-22 DIAGNOSIS — Z87891 Personal history of nicotine dependence: Secondary | ICD-10-CM | POA: Diagnosis not present

## 2018-11-22 DIAGNOSIS — Z79899 Other long term (current) drug therapy: Secondary | ICD-10-CM | POA: Diagnosis not present

## 2018-11-22 DIAGNOSIS — Y92410 Unspecified street and highway as the place of occurrence of the external cause: Secondary | ICD-10-CM | POA: Insufficient documentation

## 2018-11-22 DIAGNOSIS — S0592XA Unspecified injury of left eye and orbit, initial encounter: Secondary | ICD-10-CM | POA: Diagnosis not present

## 2018-11-22 DIAGNOSIS — Y999 Unspecified external cause status: Secondary | ICD-10-CM | POA: Insufficient documentation

## 2018-11-22 DIAGNOSIS — S0990XA Unspecified injury of head, initial encounter: Secondary | ICD-10-CM | POA: Diagnosis not present

## 2018-11-22 DIAGNOSIS — S0083XA Contusion of other part of head, initial encounter: Secondary | ICD-10-CM | POA: Diagnosis not present

## 2018-11-22 DIAGNOSIS — F1210 Cannabis abuse, uncomplicated: Secondary | ICD-10-CM | POA: Insufficient documentation

## 2018-11-22 DIAGNOSIS — Y939 Activity, unspecified: Secondary | ICD-10-CM | POA: Insufficient documentation

## 2018-11-22 LAB — CBC
HCT: 44.4 % (ref 36.0–46.0)
Hemoglobin: 15.1 g/dL — ABNORMAL HIGH (ref 12.0–15.0)
MCH: 34.8 pg — ABNORMAL HIGH (ref 26.0–34.0)
MCHC: 34 g/dL (ref 30.0–36.0)
MCV: 102.3 fL — ABNORMAL HIGH (ref 80.0–100.0)
Platelets: 231 10*3/uL (ref 150–400)
RBC: 4.34 MIL/uL (ref 3.87–5.11)
RDW: 12.8 % (ref 11.5–15.5)
WBC: 10.4 10*3/uL (ref 4.0–10.5)
nRBC: 0 % (ref 0.0–0.2)

## 2018-11-22 LAB — I-STAT CHEM 8, ED
BUN: 12 mg/dL (ref 6–20)
Calcium, Ion: 1.2 mmol/L (ref 1.15–1.40)
Chloride: 105 mmol/L (ref 98–111)
Creatinine, Ser: 0.7 mg/dL (ref 0.44–1.00)
Glucose, Bld: 74 mg/dL (ref 70–99)
HCT: 46 % (ref 36.0–46.0)
Hemoglobin: 15.6 g/dL — ABNORMAL HIGH (ref 12.0–15.0)
Potassium: 2.9 mmol/L — ABNORMAL LOW (ref 3.5–5.1)
Sodium: 141 mmol/L (ref 135–145)
TCO2: 24 mmol/L (ref 22–32)

## 2018-11-22 LAB — RAPID URINE DRUG SCREEN, HOSP PERFORMED
Amphetamines: NOT DETECTED
Barbiturates: NOT DETECTED
Benzodiazepines: POSITIVE — AB
Cocaine: POSITIVE — AB
Opiates: NOT DETECTED
Tetrahydrocannabinol: POSITIVE — AB

## 2018-11-22 LAB — ETHANOL: Alcohol, Ethyl (B): <10 mg/dL (ref <10)

## 2018-11-22 LAB — COMPREHENSIVE METABOLIC PANEL
ALT: 15 U/L (ref 0–44)
AST: 19 U/L (ref 15–41)
Albumin: 4.1 g/dL (ref 3.5–5.0)
Alkaline Phosphatase: 51 U/L (ref 38–126)
Anion gap: 11 (ref 5–15)
BUN: 12 mg/dL (ref 6–20)
CO2: 24 mmol/L (ref 22–32)
Calcium: 9.5 mg/dL (ref 8.9–10.3)
Chloride: 105 mmol/L (ref 98–111)
Creatinine, Ser: 0.82 mg/dL (ref 0.44–1.00)
GFR calc Af Amer: >60 mL/min (ref >60)
GFR calc non Af Amer: >60 mL/min (ref >60)
Glucose, Bld: 82 mg/dL (ref 70–99)
Potassium: 3 mmol/L — ABNORMAL LOW (ref 3.5–5.1)
Sodium: 140 mmol/L (ref 135–145)
Total Bilirubin: 0.7 mg/dL (ref 0.3–1.2)
Total Protein: 7.1 g/dL (ref 6.5–8.1)

## 2018-11-22 LAB — I-STAT BETA HCG BLOOD, ED (MC, WL, AP ONLY): I-stat hCG, quantitative: <5 m[IU]/mL (ref <5)

## 2018-11-22 MED ORDER — POTASSIUM CHLORIDE 10 MEQ/100ML IV SOLN
10.0000 meq | INTRAVENOUS | Status: AC
  Administered 2018-11-22 (×2): 10 meq via INTRAVENOUS
  Filled 2018-11-22 (×2): qty 100

## 2018-11-22 MED ORDER — POTASSIUM CHLORIDE 10 MEQ/100ML IV SOLN: 10.0000 meq | INTRAVENOUS | Status: DC

## 2018-11-22 MED ORDER — IOHEXOL 300 MG/ML  SOLN
100.0000 mL | Freq: Once | INTRAMUSCULAR | Status: AC | PRN
  Administered 2018-11-22: 100 mL via INTRAVENOUS

## 2018-11-22 NOTE — ED Notes (Signed)
Please call pt's aunt (She has her son) Nila Nephew at [phone] to give her an update on pt.
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
## 2018-11-22 NOTE — ED Notes (Signed)
Pt's mother requested to come back and see patient. Pt declined.

## 2018-11-22 NOTE — Discharge Instructions (Addendum)
You were evaluated in the Emergency Department and after careful evaluation, we did not find any emergent condition requiring admission or further testing in the hospital.  You may have an injury to your eye and it needs to be closely examined by ophthalmologist.  As discussed, please sleep on an incline with many pillows tonight and wear an eye patch overnight.  Dr. Talbert Forest is expecting to see you in his clinic tomorrow morning at 9 AM.  He wants you to give him a call on his personal number tomorrow morning when you arrive in the clinic.  His number is [phone].  Please return to the Emergency Department if you experience any worsening of your condition.  We encourage you to follow up with a primary care provider.  Thank you for allowing [place] to be a part of your care.
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
## 2018-11-22 NOTE — ED Notes (Signed)
Please call pt's sister Jerene Pitch at [phone] to give her an update on pt.

## 2018-11-23 NOTE — ED Notes (Signed)
Patient verbalizes understanding of discharge instructions. Opportunity for questioning and answers were provided. Armband removed by staff, pt discharged from ED ambulatory.   

## 2018-12-03 ENCOUNTER — Other Ambulatory Visit: Payer: Self-pay

## 2018-12-03 ENCOUNTER — Emergency Department
Admission: EM | Admit: 2018-12-03 | Discharge: 2018-12-04 | Disposition: A | Discharge status: Home / Self Care | Payer: Medicaid Other | Attending: Emergency Medicine | Admitting: Emergency Medicine

## 2018-12-03 DIAGNOSIS — N3000 Acute cystitis without hematuria: Secondary | ICD-10-CM | POA: Diagnosis present

## 2018-12-03 DIAGNOSIS — Z79899 Other long term (current) drug therapy: Secondary | ICD-10-CM | POA: Insufficient documentation

## 2018-12-03 DIAGNOSIS — N6325 Unspecified lump in the left breast, overlapping quadrants: Secondary | ICD-10-CM | POA: Diagnosis present

## 2018-12-03 DIAGNOSIS — F1910 Other psychoactive substance abuse, uncomplicated: Secondary | ICD-10-CM | POA: Diagnosis present

## 2018-12-03 DIAGNOSIS — R45851 Suicidal ideations: Secondary | ICD-10-CM | POA: Diagnosis not present

## 2018-12-03 DIAGNOSIS — O864 Pyrexia of unknown origin following delivery: Secondary | ICD-10-CM | POA: Diagnosis present

## 2018-12-03 DIAGNOSIS — Z87891 Personal history of nicotine dependence: Secondary | ICD-10-CM | POA: Diagnosis not present

## 2018-12-03 DIAGNOSIS — R87610 Atypical squamous cells of undetermined significance on cytologic smear of cervix (ASC-US): Secondary | ICD-10-CM | POA: Diagnosis present

## 2018-12-03 DIAGNOSIS — N6311 Unspecified lump in the right breast, upper outer quadrant: Secondary | ICD-10-CM | POA: Diagnosis present

## 2018-12-03 DIAGNOSIS — F1990 Other psychoactive substance use, unspecified, uncomplicated: Secondary | ICD-10-CM | POA: Diagnosis present

## 2018-12-03 DIAGNOSIS — R87810 Cervical high risk human papillomavirus (HPV) DNA test positive: Secondary | ICD-10-CM | POA: Diagnosis present

## 2018-12-03 LAB — COMPREHENSIVE METABOLIC PANEL
ALT: 13 U/L (ref 0–44)
AST: 14 U/L — ABNORMAL LOW (ref 15–41)
Albumin: 4.4 g/dL (ref 3.5–5.0)
Alkaline Phosphatase: 64 U/L (ref 38–126)
Anion gap: 9 (ref 5–15)
BUN: 20 mg/dL (ref 6–20)
CO2: 27 mmol/L (ref 22–32)
Calcium: 9.6 mg/dL (ref 8.9–10.3)
Chloride: 103 mmol/L (ref 98–111)
Creatinine, Ser: 0.76 mg/dL (ref 0.44–1.00)
GFR calc Af Amer: >60 mL/min (ref >60)
GFR calc non Af Amer: >60 mL/min (ref >60)
Glucose, Bld: 102 mg/dL — ABNORMAL HIGH (ref 70–99)
Potassium: 4.3 mmol/L (ref 3.5–5.1)
Sodium: 139 mmol/L (ref 135–145)
Total Bilirubin: 0.6 mg/dL (ref 0.3–1.2)
Total Protein: 7.7 g/dL (ref 6.5–8.1)

## 2018-12-03 LAB — CBC
HCT: 45.8 % (ref 36.0–46.0)
Hemoglobin: 15.2 g/dL — ABNORMAL HIGH (ref 12.0–15.0)
MCH: 33.3 pg (ref 26.0–34.0)
MCHC: 33.2 g/dL (ref 30.0–36.0)
MCV: 100.2 fL — ABNORMAL HIGH (ref 80.0–100.0)
Platelets: 230 10*3/uL (ref 150–400)
RBC: 4.57 MIL/uL (ref 3.87–5.11)
RDW: 12.5 % (ref 11.5–15.5)
WBC: 9.1 10*3/uL (ref 4.0–10.5)
nRBC: 0 % (ref 0.0–0.2)

## 2018-12-03 LAB — SALICYLATE LEVEL: Salicylate Lvl: <7 mg/dL (ref 2.8–30.0)

## 2018-12-03 LAB — ETHANOL: Alcohol, Ethyl (B): <10 mg/dL (ref <10)

## 2018-12-03 LAB — ACETAMINOPHEN LEVEL: Acetaminophen (Tylenol), Serum: <10 ug/mL — ABNORMAL LOW (ref 10–30)

## 2018-12-03 MED ORDER — NICOTINE 21 MG/24HR TD PT24
21.0000 mg | MEDICATED_PATCH | Freq: Once | TRANSDERMAL | Status: DC
  Administered 2018-12-03: 23:00:00 21 mg via TRANSDERMAL
  Filled 2018-12-03: qty 1

## 2018-12-03 NOTE — ED Notes (Signed)
Pt dressed into a combo of burgundy scrub top and blue scrub pants. This tech and [employer], Therapist, sports dressed pt out; she has the following belongings in 1 pt belongings bag: 1 grey sports bra, 1 emoji leggings, 1 black shirt, 1 sweat shirt, 1 pair of white sandals, 1 gold necklace, 1 pair of gold earrings, 1 scrunchy and 1 face mask. Clothes given to her primary nurse.

## 2018-12-03 NOTE — ED Notes (Signed)
Pt asked to keep this number safe. Lu Duffel [phone]

## 2018-12-03 NOTE — ED Notes (Signed)
Patient is upset and crying attempted to leave while talking psych NP. She said " I have to be at a rehab tomorrow. You are taking my time form my kids. I want to spend the night with my kids." She said I can't stay here I have to go home. This Optometrist tried to explain to the patient the process and redirect patient.
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
## 2018-12-04 DIAGNOSIS — F1990 Other psychoactive substance use, unspecified, uncomplicated: Secondary | ICD-10-CM | POA: Diagnosis present

## 2018-12-04 LAB — URINE DRUG SCREEN, QUALITATIVE (ARMC ONLY)
Amphetamines, Ur Screen: NOT DETECTED
Barbiturates, Ur Screen: NOT DETECTED
Benzodiazepine, Ur Scrn: POSITIVE — AB
Cannabinoid 50 Ng, Ur ~~LOC~~: POSITIVE — AB
Cocaine Metabolite,Ur ~~LOC~~: POSITIVE — AB
MDMA (Ecstasy)Ur Screen: NOT DETECTED
Methadone Scn, Ur: NOT DETECTED
Opiate, Ur Screen: NOT DETECTED
Phencyclidine (PCP) Ur S: NOT DETECTED
Tricyclic, Ur Screen: NOT DETECTED

## 2018-12-04 NOTE — ED Notes (Signed)
Hourly rounding reveals patient in room. No complaints, stable, in no acute distress. Q15 minute rounds and monitoring via Rover and Officer to continue.   

## 2018-12-04 NOTE — ED Notes (Signed)
Psych Doctor is talking with Cynthia Coffey, Cynthia Coffey is calm and cooperative.
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
## 2018-12-04 NOTE — ED Notes (Signed)
Patient came to door and complaining,, angry, saying ' I have to get to my kid, none of you people understand anything, I have been here since last night, and I need to leave, " Nurse explained that she would be seen by the psych Doctor and decision would be made about her discharge, she would have to be patient, she walked back to her bed, nurse will continue to monitor.

## 2018-12-04 NOTE — ED Notes (Signed)
Patient voices understanding of discharge instrucutions, No signs of distress, all belongings given back to Patient.
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
## 2018-12-04 NOTE — ED Provider Notes (Signed)
Patient was seen by psychiatry and cleared for discharge.   Earleen Newport, MD 12/04/18 1021

## 2018-12-04 NOTE — ED Notes (Signed)
Pt given supplies to take a shower.  

## 2018-12-29 ENCOUNTER — Emergency Department
Admission: EM | Admit: 2018-12-29 | Discharge: 2018-12-29 | Discharge status: Against Medical Advice | Payer: Medicaid Other

## 2018-12-31 ENCOUNTER — Emergency Department
Admission: EM | Admit: 2018-12-31 | Discharge: 2018-12-31 | Disposition: A | Discharge status: Home / Self Care | Payer: Medicaid Other | Attending: Student | Admitting: Student

## 2018-12-31 ENCOUNTER — Other Ambulatory Visit: Payer: Self-pay

## 2018-12-31 DIAGNOSIS — K649 Unspecified hemorrhoids: Secondary | ICD-10-CM | POA: Diagnosis present

## 2018-12-31 DIAGNOSIS — Z79899 Other long term (current) drug therapy: Secondary | ICD-10-CM | POA: Insufficient documentation

## 2018-12-31 DIAGNOSIS — Z87891 Personal history of nicotine dependence: Secondary | ICD-10-CM | POA: Insufficient documentation

## 2018-12-31 DIAGNOSIS — K642 Third degree hemorrhoids: Secondary | ICD-10-CM | POA: Insufficient documentation

## 2018-12-31 MED ORDER — POLYETHYLENE GLYCOL 3350 17 G PO PACK: 17.0000 g | PACK | Freq: Every day | ORAL | 0 refills | Status: DC

## 2018-12-31 MED ORDER — LIDOCAINE-HYDROCORT (PERIANAL) 3-0.5 % EX CREA: 1.0000 "application " | TOPICAL_CREAM | Freq: Four times a day (QID) | CUTANEOUS | 0 refills | Status: DC | PRN

## 2018-12-31 NOTE — ED Triage Notes (Signed)
Hemorrhoids X 1 week, has used suppositories without relief. Pt alert and oriented X4, cooperative, RR even and unlabored, color WNL. Pt in NAD.

## 2018-12-31 NOTE — ED Provider Notes (Signed)
[facility] Emergency Department Provider Note  ____________________________________________  Time seen: Approximately 5:16 PM  I have reviewed the triage vital signs and the nursing notes.   HISTORY  Chief Complaint Hemorrhoids    HPI TEGAN BRITAIN is a 28 y.o. female who presents the emergency department for evaluation of a painful and bleeding hemorrhoid.  Patient reports that she had a history of hemorrhoids with pregnancy has not had any ongoing problems.  Patient has had problems for the past 3 to 4 weeks with painful hemorrhoids.  She reports some mild blood with bowel movements but has no active or significant hemorrhaging.  Patient denies any other complaint or concern at this time.          Past Medical History:  Diagnosis Date  . Medical history non-contributory     Patient Active Problem List   Diagnosis Date Noted  . Substance use disorder 12/04/2018  . Postpartum fever 06/25/2017  . Acute cystitis without hematuria 06/25/2017  . ASCUS with positive high risk HPV cervical 01/10/2017  . Breast lump on right side at 10 o'clock position 09/02/2013  . Breast lump on left side at 3 o'clock position 09/02/2013    Past Surgical History:  Procedure Laterality Date  . CESAREAN SECTION N/A 06/10/2017   Procedure: CESAREAN SECTION;  Surgeon: Malachy Mood, MD;  Location: [facility] ORS;  Service: Obstetrics;  Laterality: N/A;  . NO PAST SURGERIES      Prior to Admission medications   Medication Sig Start Date End Date Taking? Authorizing Provider  acetaminophen (TYLENOL) 500 MG tablet Take 500 mg by mouth every 6 (six) hours as needed.    [provider]  citalopram (CELEXA) 20 MG tablet Take 1 tablet (20 mg total) by mouth daily. 07/25/17   Malachy Mood, MD  ibuprofen (ADVIL,MOTRIN) 400 MG tablet Take 400 mg by mouth every 6 (six) hours as needed.    [provider]  Lidocaine-Hydrocortisone Ace (LIDOCAINE-HYDROCORT,  PERIANAL,) 3-0.5 % CREA Apply 1 application topically 4 (four) times daily as needed (pain). 12/31/18   Vaniya Augspurger, Charline Bills, PA-C  polyethylene glycol (MIRALAX) 17 g packet Take 17 g by mouth daily. 12/31/18   Adrina Armijo, Charline Bills, PA-C    Allergies Patient has no known allergies.  Family History  Problem Relation Age of Onset  . Diabetes Paternal Grandfather   . Colon cancer Mother   . Throat cancer Paternal Grandmother     Social History Social History   Tobacco Use  . Smoking status: Former Smoker    Packs/day: 0.25    Types: Cigarettes    Quit date: 10/10/2016    Years since quitting: 2.2  . Smokeless tobacco: Never Used  Substance Use Topics  . Alcohol use: Yes  . Drug use: Yes    Types: Marijuana, Cocaine     Review of Systems  Constitutional: No fever/chills Eyes: No visual changes. No discharge ENT: No upper respiratory complaints. Cardiovascular: no chest pain. Respiratory: no cough. No SOB. Gastrointestinal: No abdominal pain.  No nausea, no vomiting.  No diarrhea.  No constipation.  Positive for hemorrhoid Genitourinary: Negative for dysuria. No hematuria Musculoskeletal: Negative for musculoskeletal pain. Skin: Negative for rash, abrasions, lacerations, ecchymosis. Neurological: Negative for headaches, focal weakness or numbness. 10-point ROS otherwise negative.  ____________________________________________   PHYSICAL EXAM:  VITAL SIGNS: ED Triage Vitals [12/31/18 1623]  Enc Vitals Group     BP (!) 142/78     Pulse Rate 99     Resp  16     Temp 99.1 F (37.3 C)     Temp Source Oral     SpO2 100 %     Weight 150 lb (68 kg)     Height 5\' 5"  (1.651 m)     Head Circumference      Peak Flow      Pain Score 6     Pain Loc      Pain Edu?      Excl. in GC?      Constitutional: Alert and oriented. Well appearing and in no acute distress. Eyes: Conjunctivae are normal. PERRL. EOMI. Head: Atraumatic. ENT:      Ears:       Nose: No  congestion/rhinnorhea.      Mouth/Throat: Mucous membranes are moist.  Neck: No stridor.    Cardiovascular: Normal rate, regular rhythm. Normal S1 and S2.  Good peripheral circulation. Respiratory: Normal respiratory effort without tachypnea or retractions. Lungs CTAB. Good air entry to the bases with no decreased or absent breath sounds. Gastrointestinal: Bowel sounds 4 quadrants. Soft and nontender to palpation. No guarding or rigidity. No palpable masses. No distention. No CVA tenderness.  Evaluation of the anus/rectum reveals hemorrhoid, grade 3 to the 9 o'clock position.  No active bleeding.  No surrounding perianal/perirectal erythema or edema.  Other than hemorrhoid, no tenderness to palpation. Musculoskeletal: Full range of motion to all extremities. No gross deformities appreciated. Neurologic:  Normal speech and language. No gross focal neurologic deficits are appreciated.  Skin:  Skin is warm, dry and intact. No rash noted. Psychiatric: Mood and affect are normal. Speech and behavior are normal. Patient exhibits appropriate insight and judgement.   ____________________________________________   LABS (all labs ordered are listed, but only abnormal results are displayed)  Labs Reviewed - No data to display ____________________________________________  EKG   ____________________________________________  RADIOLOGY   No results found.  ____________________________________________    PROCEDURES  Procedure(s) performed:    Procedures    Medications - No data to display   ____________________________________________   INITIAL IMPRESSION / ASSESSMENT AND PLAN / ED COURSE  Pertinent labs & imaging results that were available during my care of the patient were reviewed by me and considered in my medical decision making (see chart for details).  Review of the [place] CSRS was performed in accordance of the [facility] prior to dispensing any controlled drugs.            Patient's diagnosis is consistent with grade 3 hemorrhoid.  Patient presented to the emergency department complaining of a painful, minimally bleeding hemorrhoid.  Patient states that her grandmother told her to present to emergency department so that we could "band it."  I explained that banding and other surgical procedures were not performed emergency department.  Patient verbalizes understanding.  I will place the patient on lidocaine and hydrocortisone perianal cream.  Patient will also have prescription for MiraLAX.  I encouraged the patient to use sitz bath.. Patient is to follow-up with general surgery.  Patient is given ED precautions to return to the ED for any worsening or new symptoms.     ____________________________________________  FINAL CLINICAL IMPRESSION(S) / ED DIAGNOSES  Final diagnoses:  Grade III hemorrhoids      NEW MEDICATIONS STARTED DURING THIS VISIT:  ED Discharge Orders         Ordered    Lidocaine-Hydrocortisone Ace (LIDOCAINE-HYDROCORT, PERIANAL,) 3-0.5 % CREA  4 times daily PRN     12/31/18 1738  polyethylene glycol (MIRALAX) 17 g packet  Daily     12/31/18 1738              This chart was dictated using voice recognition software/Dragon. Despite best efforts to proofread, errors can occur which can change the meaning. Any change was purely unintentional.    Racheal Patches, PA-C 12/31/18 1750    Miguel Aschoff., MD 01/01/19 [phone]

## 2019-01-01 ENCOUNTER — Telehealth: Payer: Self-pay

## 2019-01-01 NOTE — Telephone Encounter (Signed)
Left message on VM letting patient know appointment made to see Dr.Rodenberg 01/09/2019 at 9 am.

## 2019-01-01 NOTE — Telephone Encounter (Signed)
Patient called stating she received a voice message and couldn't understand message that was left. Patient was notified of upcoming appointment with Dr.Rodenberg on 01/09/19 at [place]. Patient was given directions to our office. Patient verbalizes understanding.

## 2019-01-09 ENCOUNTER — Other Ambulatory Visit: Payer: Self-pay

## 2019-01-09 ENCOUNTER — Encounter: Payer: Self-pay | Admitting: Surgery

## 2019-01-09 ENCOUNTER — Ambulatory Visit (INDEPENDENT_AMBULATORY_CARE_PROVIDER_SITE_OTHER): Payer: Medicaid Other | Admitting: Surgery

## 2019-01-09 VITALS — BP 117/79 | HR 73 | Temp 97.7°F | Ht 63.0 in | Wt 150.0 lb

## 2019-01-09 DIAGNOSIS — K645 Perianal venous thrombosis: Secondary | ICD-10-CM

## 2019-01-09 NOTE — Progress Notes (Signed)
Patient ID: Cynthia Coffey, female   DOB: 1990/05/22, 28 y.o.   MRN: [id number]  Chief Complaint: Painful hemorrhoids  History of Present Illness Cynthia Coffey is a 28 y.o. female with anal pain for which she presented to the ED.  She reports a history of internal hemorrhoids present for years.  She admits to a history of constipation where she may reduce her bowel movements to a frequency of 1-2 times per week with associated straining.  However she believes most of her development of hemorrhoids is due to the labor and childbirth of her 3 children.  She had an episode that began on October 31 and was seen in the ED where she was treated with lidocaine hydrocortisone.  She believes this is been of great help and is currently without significant pain, burning or itching.  She is still performing sitz bath's.  She has no family history of colon cancer and has had no history of colonoscopy.  She denies bleeding she reports she has occasional soiling on her underwear..  Past Medical History Past Medical History:  Diagnosis Date  . Medical history non-contributory        Past Surgical History:  Procedure Laterality Date  . CESAREAN SECTION N/A 06/10/2017   Procedure: CESAREAN SECTION;  Surgeon: Malachy Mood, MD;  Location: [facility] ORS;  Service: Obstetrics;  Laterality: N/A;  . NO PAST SURGERIES      No Known Allergies  Current Outpatient Medications  Medication Sig Dispense Refill  . acetaminophen (TYLENOL) 500 MG tablet Take 500 mg by mouth every 6 (six) hours as needed.    . citalopram (CELEXA) 20 MG tablet Take 1 tablet (20 mg total) by mouth daily. 30 tablet 2  . ibuprofen (ADVIL,MOTRIN) 400 MG tablet Take 400 mg by mouth every 6 (six) hours as needed.    Marland Kitchen levonorgestrel (MIRENA) 20 MCG/24HR IUD 1 each by Intrauterine route once.    . Lidocaine-Hydrocortisone Ace (LIDOCAINE-HYDROCORT, PERIANAL,) 3-0.5 % CREA Apply 1 application topically 4 (four) times daily as needed (pain).  30 g 0  . polyethylene glycol (MIRALAX) 17 g packet Take 17 g by mouth daily. 30 each 0   No current facility-administered medications for this visit.     Family History Family History  Problem Relation Age of Onset  . Diabetes Paternal Grandfather   . Throat cancer Paternal Grandmother        Social History Social History   Tobacco Use  . Smoking status: Former Smoker    Packs/day: 0.25    Types: Cigarettes    Quit date: 10/10/2016    Years since quitting: 2.2  . Smokeless tobacco: Never Used  Substance Use Topics  . Alcohol use: Yes  . Drug use: Yes    Types: Marijuana, Cocaine        Review of Systems  Constitutional: Negative.   HENT: Negative.   Eyes: Negative.   Respiratory: Negative.   Cardiovascular: Negative.   Gastrointestinal: Positive for constipation. Negative for abdominal pain, blood in stool, diarrhea, heartburn, melena, nausea and vomiting.  Genitourinary: Negative.   Musculoskeletal: Negative.   Skin: Negative.   Endo/Heme/Allergies: Negative.      Physical Exam Blood pressure 117/79, pulse 73, temperature 97.7 F (36.5 C), height 5\' 3"  (1.6 m), weight 150 lb (68 kg), last menstrual period 12/04/2018, SpO2 97 %, not currently breastfeeding.  CONSTITUTIONAL: Pleasant and bright Caucasian female in no apparent distress.  Well-developed and well-nourished. EYES: Pupils equal, round, and reactive to light, Sclera  non-icteric. EARS, NOSE, MOUTH AND THROAT: The oropharynx is clear. Oral mucosa is pink and moist. Hearing is intact to voice.    RESPIRATORY:  Lungs are clear, and breath sounds are equal bilaterally. Normal respiratory effort without pathologic use of accessory muscles. CARDIOVASCULAR: Heart is regular. GI: The abdomen is soft, nontender, and nondistended.  GU: With Eber Jones present as chaperone, the patient is placed in a left lateral decubitus position and a digital anal examination is performed.  There is a singular previously  thrombosed hemorrhoid at 11:00.  By appearance and density is not acutely thrombosed.  There is minimal other hemorrhoidal tissue present, no appreciable fissure.  No perianal induration.  No bright red blood on glove. MUSCULOSKELETAL:  Normal muscle strength and tone in all four extremities.    SKIN: Skin turgor is normal. There are no pathologic skin lesions.  NEUROLOGIC:  Motor and sensation is grossly normal.  Cranial nerves are grossly intact. PSYCH:  Alert and oriented to person, place and time. Affect is normal.  Data Reviewed ED chart reviewed  I have personally reviewed the patient's imaging and medical records.    Assessment    Constipation, intermittent. Thrombosed external hemorrhoid now a 10 days to 2 weeks out. Plan    I believe she suffered the worst pain associated with the acute thrombosis, we need to focus on avoiding further trauma to the area with persisting constipation.  We discussed gummy fibers and fluid supplementation and with goals of fiber intake being 25 to 30 g daily.  We also discussed normal bowel activity being 1-2 bowel movements daily and that skipping days is not to be tolerated.  I believe her body will continue reabsorption of that clotted hemorrhoid and make intervention unnecessary.  I have asked her to follow-up in 2 months to ensure that she has made the progress desired.  Face-to-face time spent with the patient and care providers was 30 minutes, with more than 50% of the time spent counseling, educating, and coordinating care of the patient.      [employer]....? Yes 01/09/2019, 9:29 AM

## 2019-01-09 NOTE — Patient Instructions (Addendum)
May use gummi fibers two a day to start with. You may take less if you stools are too loose or you have too much gas.   Follow up here in 2 months.   Call if you do not get better or the pain worsens.  High-Fiber Diet Fiber, also called dietary fiber, is a type of carbohydrate that is found in fruits, vegetables, whole grains, and beans. A high-fiber diet can have many health benefits. Your health care provider may recommend a high-fiber diet to help:  Prevent constipation. Fiber can make your bowel movements more regular.  Lower your cholesterol.  Relieve the following conditions: ? Swelling of veins in the anus (hemorrhoids). ? Swelling and irritation (inflammation) of specific areas of the digestive tract (uncomplicated diverticulosis). ? A problem of the large intestine (colon) that sometimes causes pain and diarrhea (irritable bowel syndrome, IBS).  Prevent overeating as part of a weight-loss plan.  Prevent heart disease, type 2 diabetes, and certain cancers. What is my plan? The recommended daily fiber intake in grams (g) includes:  38 g for men age 30 or younger.  30 g for men over age 14.  77 g for women age 30 or younger.  21 g for women over age 30. You can get the recommended daily intake of dietary fiber by:  Eating a variety of fruits, vegetables, grains, and beans.  Taking a fiber supplement, if it is not possible to get enough fiber through your diet. What do I need to know about a high-fiber diet?  It is better to get fiber through food sources rather than from fiber supplements. There is not a lot of research about how effective supplements are.  Always check the fiber content on the nutrition facts label of any prepackaged food. Look for foods that contain 5 g of fiber or more per serving.  Talk with a diet and nutrition specialist (dietitian) if you have questions about specific foods that are recommended or not recommended for your medical condition,  especially if those foods are not listed below.  Gradually increase how much fiber you consume. If you increase your intake of dietary fiber too quickly, you may have bloating, cramping, or gas.  Drink plenty of water. Water helps you to digest fiber. What are tips for following this plan?  Eat a wide variety of high-fiber foods.  Make sure that half of the grains that you eat each day are whole grains.  Eat breads and cereals that are made with whole-grain flour instead of refined flour or white flour.  Eat brown rice, bulgur wheat, or millet instead of white rice.  Start the day with a breakfast that is high in fiber, such as a cereal that contains 5 g of fiber or more per serving.  Use beans in place of meat in soups, salads, and pasta dishes.  Eat high-fiber snacks, such as berries, raw vegetables, nuts, and popcorn.  Choose whole fruits and vegetables instead of processed forms like juice or sauce. What foods can I eat?  Fruits Berries. Pears. Apples. Oranges. Avocado. Prunes and raisins. Dried figs. Vegetables Sweet potatoes. Spinach. Kale. Artichokes. Cabbage. Broccoli. Cauliflower. Green peas. Carrots. Squash. Grains Whole-grain breads. Multigrain cereal. Oats and oatmeal. Brown rice. Barley. Bulgur wheat. Plains. Quinoa. Bran muffins. Popcorn. Rye wafer crackers. Meats and other proteins Navy, kidney, and pinto beans. Soybeans. Split peas. Lentils. Nuts and seeds. Dairy Fiber-fortified yogurt. Beverages Fiber-fortified soy milk. Fiber-fortified orange juice. Other foods Fiber bars. The items listed above  may not be a complete list of recommended foods and beverages. Contact a dietitian for more options. What foods are not recommended? Fruits Fruit juice. Cooked, strained fruit. Vegetables Fried potatoes. Canned vegetables. Well-cooked vegetables. Grains White bread. Pasta made with refined flour. White rice. Meats and other proteins Fatty cuts of meat. Fried  chicken or fried fish. Dairy Milk. Yogurt. Cream cheese. Sour cream. Fats and oils Butters. Beverages Soft drinks. Other foods Cakes and pastries. The items listed above may not be a complete list of foods and beverages to avoid. Contact a dietitian for more information. Summary  Fiber is a type of carbohydrate. It is found in fruits, vegetables, whole grains, and beans.  There are many health benefits of eating a high-fiber diet, such as preventing constipation, lowering blood cholesterol, helping with weight loss, and reducing your risk of heart disease, diabetes, and certain cancers.  Gradually increase your intake of fiber. Increasing too fast can result in cramping, bloating, and gas. Drink plenty of water while you increase your fiber.  The best sources of fiber include whole fruits and vegetables, whole grains, nuts, seeds, and beans. This information is not intended to replace advice given to you by your health care provider. Make sure you discuss any questions you have with your health care provider. Document Released: 02/13/2005 Document Revised: 12/18/2016 Document Reviewed: 12/18/2016 Elsevier Patient Education  2020 [employer].

## 2019-02-14 ENCOUNTER — Encounter: Payer: Self-pay | Admitting: Physician Assistant

## 2019-02-14 ENCOUNTER — Emergency Department
Admission: EM | Admit: 2019-02-14 | Discharge: 2019-02-14 | Disposition: A | Discharge status: Home / Self Care | Payer: Medicaid Other | Attending: Emergency Medicine | Admitting: Emergency Medicine

## 2019-02-14 ENCOUNTER — Other Ambulatory Visit: Payer: Self-pay

## 2019-02-14 DIAGNOSIS — Z5321 Procedure and treatment not carried out due to patient leaving prior to being seen by health care provider: Secondary | ICD-10-CM | POA: Diagnosis not present

## 2019-02-14 DIAGNOSIS — R6884 Jaw pain: Secondary | ICD-10-CM | POA: Insufficient documentation

## 2019-02-14 NOTE — ED Notes (Signed)
Went to bring pt to room. Pt states she does not actually want to be seen by provider and is only here because the jail told her to. Pt states she needs to be in court shortly and does not want to be late. Requesting to leave AMA. Paper AMA forms brought to pt, pt signed 2 copies: 1 for hospital records and 1 copy to take back to jail for their documentation. Pt left in police custody. Ambulatory with steady gait.
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
## 2019-03-11 ENCOUNTER — Ambulatory Visit: Payer: Medicaid Other | Admitting: Surgery

## 2019-03-24 ENCOUNTER — Encounter: Payer: Self-pay | Admitting: Emergency Medicine

## 2019-03-24 ENCOUNTER — Emergency Department
Admission: EM | Admit: 2019-03-24 | Discharge: 2019-03-24 | Disposition: A | Discharge status: Home / Self Care | Payer: Medicaid Other | Attending: Emergency Medicine | Admitting: Emergency Medicine

## 2019-03-24 ENCOUNTER — Other Ambulatory Visit: Payer: Self-pay

## 2019-03-24 ENCOUNTER — Emergency Department: Payer: Medicaid Other

## 2019-03-24 DIAGNOSIS — Y939 Activity, unspecified: Secondary | ICD-10-CM | POA: Insufficient documentation

## 2019-03-24 DIAGNOSIS — Y92410 Unspecified street and highway as the place of occurrence of the external cause: Secondary | ICD-10-CM | POA: Diagnosis not present

## 2019-03-24 DIAGNOSIS — M7661 Achilles tendinitis, right leg: Secondary | ICD-10-CM

## 2019-03-24 DIAGNOSIS — Z87891 Personal history of nicotine dependence: Secondary | ICD-10-CM | POA: Diagnosis not present

## 2019-03-24 DIAGNOSIS — Y999 Unspecified external cause status: Secondary | ICD-10-CM | POA: Diagnosis not present

## 2019-03-24 DIAGNOSIS — M79671 Pain in right foot: Secondary | ICD-10-CM | POA: Diagnosis present

## 2019-03-24 MED ORDER — MELOXICAM 15 MG PO TABS: 15.0000 mg | ORAL_TABLET | Freq: Every day | ORAL | 0 refills | Status: DC
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
## 2019-03-24 NOTE — Discharge Instructions (Signed)
Obtain and use an ASO lace up stirrup ankle brace for additional symptom improvement.  You may find this brace at sport stores such as [employer], pharmacies such as [facility] or [facility], or other store such as [employer].  I would recommend use of this brace for the next 2 to 3 months to ensure no repeat injury.
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
## 2021-07-10 ENCOUNTER — Encounter: Payer: Self-pay | Admitting: Emergency Medicine

## 2021-07-10 ENCOUNTER — Emergency Department
Admission: EM | Admit: 2021-07-10 | Discharge: 2021-07-10 | Disposition: A | Discharge status: Home / Self Care | Payer: Medicaid Other | Attending: Emergency Medicine | Admitting: Emergency Medicine

## 2021-07-10 DIAGNOSIS — L539 Erythematous condition, unspecified: Secondary | ICD-10-CM | POA: Diagnosis not present

## 2021-07-10 DIAGNOSIS — R21 Rash and other nonspecific skin eruption: Secondary | ICD-10-CM | POA: Insufficient documentation

## 2021-07-10 LAB — URINALYSIS, ROUTINE W REFLEX MICROSCOPIC
Bilirubin Urine: NEGATIVE
Glucose, UA: NEGATIVE mg/dL
Hgb urine dipstick: NEGATIVE
Ketones, ur: NEGATIVE mg/dL
Leukocytes,Ua: NEGATIVE
Nitrite: NEGATIVE
Protein, ur: NEGATIVE mg/dL
Specific Gravity, Urine: 1.018 (ref 1.005–1.030)
pH: 7 (ref 5.0–8.0)

## 2021-07-10 LAB — CBC WITH DIFFERENTIAL/PLATELET
Abs Immature Granulocytes: 0.02 10*3/uL (ref 0.00–0.07)
Basophils Absolute: 0.1 10*3/uL (ref 0.0–0.1)
Basophils Relative: 1 %
Eosinophils Absolute: 0.1 10*3/uL (ref 0.0–0.5)
Eosinophils Relative: 1 %
HCT: 39.1 % (ref 36.0–46.0)
Hemoglobin: 12.3 g/dL (ref 12.0–15.0)
Immature Granulocytes: 0 %
Lymphocytes Relative: 45 %
Lymphs Abs: 3.5 10*3/uL (ref 0.7–4.0)
MCH: 32.5 pg (ref 26.0–34.0)
MCHC: 31.5 g/dL (ref 30.0–36.0)
MCV: 103.4 fL — ABNORMAL HIGH (ref 80.0–100.0)
Monocytes Absolute: 0.4 10*3/uL (ref 0.1–1.0)
Monocytes Relative: 5 %
Neutro Abs: 3.9 10*3/uL (ref 1.7–7.7)
Neutrophils Relative %: 48 %
Platelets: 242 10*3/uL (ref 150–400)
RBC: 3.78 MIL/uL — ABNORMAL LOW (ref 3.87–5.11)
RDW: 12.7 % (ref 11.5–15.5)
WBC: 7.9 10*3/uL (ref 4.0–10.5)
nRBC: 0 % (ref 0.0–0.2)

## 2021-07-10 LAB — COMPREHENSIVE METABOLIC PANEL
ALT: 16 U/L (ref 0–44)
AST: 14 U/L — ABNORMAL LOW (ref 15–41)
Albumin: 3.6 g/dL (ref 3.5–5.0)
Alkaline Phosphatase: 53 U/L (ref 38–126)
Anion gap: 4 — ABNORMAL LOW (ref 5–15)
BUN: 16 mg/dL (ref 6–20)
CO2: 27 mmol/L (ref 22–32)
Calcium: 9 mg/dL (ref 8.9–10.3)
Chloride: 111 mmol/L (ref 98–111)
Creatinine, Ser: 0.65 mg/dL (ref 0.44–1.00)
GFR, Estimated: >60 mL/min (ref >60)
Glucose, Bld: 76 mg/dL (ref 70–99)
Potassium: 4.3 mmol/L (ref 3.5–5.1)
Sodium: 142 mmol/L (ref 135–145)
Total Bilirubin: 0.4 mg/dL (ref 0.3–1.2)
Total Protein: 6.5 g/dL (ref 6.5–8.1)

## 2021-07-10 LAB — POC URINE PREG, ED: Preg Test, Ur: NEGATIVE

## 2021-07-10 MED ORDER — MUPIROCIN 2 % EX OINT: 1.0000 "application " | TOPICAL_OINTMENT | Freq: Two times a day (BID) | CUTANEOUS | 0 refills | Status: AC

## 2021-07-10 MED ORDER — CEPHALEXIN 500 MG PO CAPS: 500.0000 mg | ORAL_CAPSULE | Freq: Four times a day (QID) | ORAL | 0 refills | Status: AC

## 2021-07-10 MED ORDER — DOXYCYCLINE MONOHYDRATE 100 MG PO TABS: 100.0000 mg | ORAL_TABLET | Freq: Two times a day (BID) | ORAL | 0 refills | Status: AC

## 2021-07-10 NOTE — Discharge Instructions (Signed)
Take Keflex, 2 tablets in the morning and 2 tablets at night. ?Take 1 tablet of doxycycline in the morning and 1 tablet at night. ?Apply mupirocin twice daily. ?

## 2021-07-10 NOTE — ED Triage Notes (Signed)
Pt here from home with c/o skin ulcer / rash times 1 weeks started on her right hand and has spread to her legs and face , no fevers  ?

## 2021-07-10 NOTE — ED Provider Notes (Signed)
? ?[facility] ?Provider Note ? ?Patient Contact: 9:36 PM (approximate) ? ? ?History  ? ?Skin Ulcer ? ? ?HPI ? ?Cynthia Coffey is a 31 y.o. female presents to the emergency department with erythematous and ulcerative rash along the upper and lower extremities.  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  She denies fever or chills.  Denies a history of similar symptoms in the past.  No chest pain, chest tightness or abdominal pain.  She is uncertain about the possibility of pregnancy. ? ?  ? ? ?Physical Exam  ? ?Triage Vital Signs: ?ED Triage Vitals  ?Enc Vitals Group  ?   BP 07/10/21 1955 121/81  ?   Pulse Rate 07/10/21 1955 93  ?   Resp 07/10/21 1955 20  ?   Temp 07/10/21 1955 98.7 ?F (37.1 ?C)  ?   Temp Source 07/10/21 1955 Oral  ?   SpO2 07/10/21 1955 99 %  ?   Weight 07/10/21 1955 140 lb (63.5 kg)  ?   Height 07/10/21 1955 5\' 4"  (1.626 m)  ?   Head Circumference --   ?   Peak Flow --   ?   Pain Score 07/10/21 2000 5  ?   Pain Loc --   ?   Pain Edu? --   ?   Excl. in GC? --   ? ? ?Most recent vital signs: ?Vitals:  ? 07/10/21 1955 07/10/21 2220  ?BP: 121/81 124/86  ?Pulse: 93 94  ?Resp: 20 18  ?Temp: 98.7 ?F (37.1 ?C) 98.6 ?F (37 ?C)  ?SpO2: 99% 98%  ? ? ? ?General: Alert and in no acute distress. ?Eyes:  PERRL. EOMI. ?Head: No acute traumatic findings ?ENT: ?     Nose: No congestion/rhinnorhea. ?     Mouth/Throat: Mucous membranes are moist. ?Neck: No stridor. No cervical spine tenderness to palpation. ?Cardiovascular:  Good peripheral perfusion ?Respiratory: Normal respiratory effort without tachypnea or retractions. Lungs CTAB. Good air entry to the bases with no decreased or absent breath sounds. ?Gastrointestinal: Bowel sounds ?4 quadrants. Soft and nontender to palpation. No guarding or rigidity. No palpable masses. No distention. No CVA tenderness. ?Musculoskeletal: Full range of motion to all extremities.  ?Neurologic:  No  gross focal neurologic deficits are appreciated.  ?Skin: Patient has ulcerative an erythematous rash along the upper and lower extremities. ? ? ?ED Results / Procedures / Treatments  ? ?Labs ?(all labs ordered are listed, but only abnormal results are displayed) ?Labs Reviewed  ?CBC WITH DIFFERENTIAL/PLATELET - Abnormal; Notable for the following components:  ?    Result Value  ? RBC 3.78 (*)   ? MCV 103.4 (*)   ? All other components within normal limits  ?COMPREHENSIVE METABOLIC PANEL - Abnormal; Notable for the following components:  ? AST 14 (*)   ? Anion gap 4 (*)   ? All other components within normal limits  ?URINALYSIS, ROUTINE W REFLEX MICROSCOPIC - Abnormal; Notable for the following components:  ? Color, Urine YELLOW (*)   ? APPearance CLOUDY (*)   ? All other components within normal limits  ?POC URINE PREG, ED - Normal  ?RPR  ? ? ? ? ? ?PROCEDURES: ? ?Critical Care performed: No ? ?Procedures ? ? ?MEDICATIONS ORDERED IN ED: ?Medications - No data to display ? ? ?IMPRESSION / MDM / ASSESSMENT AND PLAN / ED COURSE  ?I reviewed the triage vital signs and  the nursing notes. ?             ?               ? ?Assessment and plan ?Rash ?31 year old female presents to the emergency department with an ulcerative and erythematous rash along the upper and lower extremities with recent meth use. ? ?Vital signs are reassuring at triage.  On physical exam, patient was alert, active and nontoxic-appearing.  Patient tested negative for pregnancy.  We will treat patient with both Keflex and doxycycline.  RPR in process.  Patient was also prescribed topical mupirocin. ?  ? ? ?FINAL CLINICAL IMPRESSION(S) / ED DIAGNOSES  ? ?Final diagnoses:  ?Rash  ? ? ? ?Rx / DC Orders  ? ?ED Discharge Orders   ? ?      Ordered  ?  cephALEXin (KEFLEX) 500 MG capsule  4 times daily       ? 07/10/21 2213  ?  doxycycline (ADOXA) 100 MG tablet  2 times daily       ? 07/10/21 2213  ?  mupirocin ointment (BACTROBAN) 2 %  2 times daily       ?  07/10/21 2214  ? ?  ?  ? ?  ? ? ? ?Note:  This document was prepared using Dragon voice recognition software and may include unintentional dictation errors. ?  ?Orvil Feil, PA-C ?07/10/21 2233 ? ?  ?Georga Hacking, MD ?07/12/21 0012 ? ?

## 2021-07-11 LAB — SYPHILIS: RPR W/REFLEX TO RPR TITER AND TREPONEMAL ANTIBODIES, TRADITIONAL SCREENING AND DIAGNOSIS ALGORITHM: RPR Ser Ql: NONREACTIVE
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
## 2021-11-30 ENCOUNTER — Emergency Department
Admission: EM | Admit: 2021-11-30 | Discharge: 2021-11-30 | Disposition: A | Discharge status: Home / Self Care | Payer: Commercial Managed Care - HMO | Attending: Student in an Organized Health Care Education/Training Program | Admitting: Student in an Organized Health Care Education/Training Program

## 2021-11-30 ENCOUNTER — Other Ambulatory Visit: Payer: Self-pay

## 2021-11-30 ENCOUNTER — Emergency Department: Payer: Commercial Managed Care - HMO

## 2021-11-30 ENCOUNTER — Encounter: Payer: Self-pay | Admitting: *Deleted

## 2021-11-30 DIAGNOSIS — O209 Hemorrhage in early pregnancy, unspecified: Secondary | ICD-10-CM | POA: Diagnosis present

## 2021-11-30 DIAGNOSIS — Z3A17 17 weeks gestation of pregnancy: Secondary | ICD-10-CM | POA: Insufficient documentation

## 2021-11-30 DIAGNOSIS — N3000 Acute cystitis without hematuria: Secondary | ICD-10-CM

## 2021-11-30 DIAGNOSIS — O2312 Infections of bladder in pregnancy, second trimester: Secondary | ICD-10-CM | POA: Insufficient documentation

## 2021-11-30 DIAGNOSIS — O4690 Antepartum hemorrhage, unspecified, unspecified trimester: Secondary | ICD-10-CM

## 2021-11-30 LAB — COMPREHENSIVE METABOLIC PANEL
ALT: 15 U/L (ref 0–44)
AST: 17 U/L (ref 15–41)
Albumin: 3.5 g/dL (ref 3.5–5.0)
Alkaline Phosphatase: 43 U/L (ref 38–126)
Anion gap: 8 (ref 5–15)
BUN: 7 mg/dL (ref 6–20)
CO2: 25 mmol/L (ref 22–32)
Calcium: 9.2 mg/dL (ref 8.9–10.3)
Chloride: 107 mmol/L (ref 98–111)
Creatinine, Ser: 0.59 mg/dL (ref 0.44–1.00)
GFR, Estimated: >60 mL/min (ref >60)
Glucose, Bld: 79 mg/dL (ref 70–99)
Potassium: 4.2 mmol/L (ref 3.5–5.1)
Sodium: 140 mmol/L (ref 135–145)
Total Bilirubin: 0.3 mg/dL (ref 0.3–1.2)
Total Protein: 7.1 g/dL (ref 6.5–8.1)

## 2021-11-30 LAB — URINALYSIS, ROUTINE W REFLEX MICROSCOPIC
Bilirubin Urine: NEGATIVE
Glucose, UA: NEGATIVE mg/dL
Hgb urine dipstick: NEGATIVE
Ketones, ur: NEGATIVE mg/dL
Leukocytes,Ua: NEGATIVE
Nitrite: POSITIVE — AB
Protein, ur: NEGATIVE mg/dL
Specific Gravity, Urine: 1.015 (ref 1.005–1.030)
pH: 7 (ref 5.0–8.0)

## 2021-11-30 LAB — CBC
HCT: 40.3 % (ref 36.0–46.0)
Hemoglobin: 13.1 g/dL (ref 12.0–15.0)
MCH: 32.1 pg (ref 26.0–34.0)
MCHC: 32.5 g/dL (ref 30.0–36.0)
MCV: 98.8 fL (ref 80.0–100.0)
Platelets: 249 10*3/uL (ref 150–400)
RBC: 4.08 MIL/uL (ref 3.87–5.11)
RDW: 14.8 % (ref 11.5–15.5)
WBC: 12.9 10*3/uL — ABNORMAL HIGH (ref 4.0–10.5)
nRBC: 0 % (ref 0.0–0.2)

## 2021-11-30 LAB — POC URINE PREG, ED: Preg Test, Ur: POSITIVE — AB

## 2021-11-30 LAB — HCG, QUANTITATIVE, PREGNANCY: hCG, Beta Chain, Quant, S: 16657 m[IU]/mL — ABNORMAL HIGH (ref <5)

## 2021-11-30 MED ORDER — SODIUM CHLORIDE 0.9 % IV SOLN
1.0000 g | Freq: Once | INTRAVENOUS | Status: AC
  Administered 2021-11-30: 1 g via INTRAVENOUS
  Filled 2021-11-30: qty 10

## 2021-11-30 MED ORDER — CEPHALEXIN 500 MG PO CAPS: 500.0000 mg | ORAL_CAPSULE | Freq: Three times a day (TID) | ORAL | 0 refills | Status: AC

## 2021-11-30 MED ORDER — SODIUM CHLORIDE 0.9 % IV BOLUS
500.0000 mL | Freq: Once | INTRAVENOUS | Status: AC
  Administered 2021-11-30: 500 mL via INTRAVENOUS

## 2021-11-30 NOTE — ED Notes (Signed)
Poct pregnancy Positive 

## 2021-11-30 NOTE — ED Provider Notes (Signed)
[facility] Provider Note    Event Date/Time   First MD Initiated Contact with Patient 11/30/21 1924     (approximate)   History   Abdominal Pain (vag) and Vaginal Bleeding   HPI  Cynthia Coffey is a 31 y.o. female  [phone] who presents to the ER for evaluation of dysuria suprapubic cramping and vaginal spotting over the past 24 hours.  States she was recently admitted to [facility] and was given IV antibiotics and discharged on antibiotics for UTI but did not take the antibiotics because the pill bottles said that it was not to be taken during pregnancy.  She denies any nausea or vomiting.  Denies any fevers no back pain.  No chest pain or shortness of breath.        Physical Exam   Triage Vital Signs: ED Triage Vitals [11/30/21 1908]  Enc Vitals Group     BP 124/81     Pulse Rate (!) 114     Resp 20     Temp 98.2 F (36.8 C)     Temp Source Oral     SpO2 98 %     Weight 140 lb (63.5 kg)     Height 5\' 4"  (1.626 m)     Head Circumference      Peak Flow      Pain Score 6     Pain Loc      Pain Edu?      Excl. in GC?     Most recent vital signs: Vitals:   11/30/21 1908  BP: 124/81  Pulse: (!) 114  Resp: 20  Temp: 98.2 F (36.8 C)  SpO2: 98%     Constitutional: Alert  Eyes: Conjunctivae are normal.  Head: Atraumatic. Nose: No congestion/rhinnorhea. Mouth/Throat: Mucous membranes are moist.   Neck: Painless ROM.  Cardiovascular:   Good peripheral circulation. Respiratory: Normal respiratory effort.  No retractions.  Gastrointestinal: Soft and nontender, gravid  Musculoskeletal:  no deformity Neurologic:  MAE spontaneously. No gross focal neurologic deficits are appreciated.  Skin:  Skin is warm, dry and intact. No rash noted. Psychiatric: Mood and affect are normal. Speech and behavior are normal.    ED Results / Procedures / Treatments   Labs (all labs ordered are listed, but only abnormal results are displayed) Labs  Reviewed  CBC - Abnormal; Notable for the following components:      Result Value   WBC 12.9 (*)    All other components within normal limits  URINALYSIS, ROUTINE W REFLEX MICROSCOPIC - Abnormal; Notable for the following components:   Color, Urine YELLOW (*)    APPearance HAZY (*)    Nitrite POSITIVE (*)    Bacteria, UA MANY (*)    All other components within normal limits  HCG, QUANTITATIVE, PREGNANCY - Abnormal; Notable for the following components:   hCG, Beta Chain, Quant, S 16,657 (*)    All other components within normal limits  POC URINE PREG, ED - Abnormal; Notable for the following components:   Preg Test, Ur POSITIVE (*)    All other components within normal limits  COMPREHENSIVE METABOLIC PANEL     EKG     RADIOLOGY Please see ED Course for my review and interpretation.  I personally reviewed all radiographic images ordered to evaluate for the above acute complaints and reviewed radiology reports and findings.  These findings were personally discussed with the patient.  Please see medical record for radiology report.  PROCEDURES:  Critical Care performed:   Procedures   MEDICATIONS ORDERED IN ED: Medications  sodium chloride 0.9 % bolus 500 mL (0 mLs Intravenous Stopped 11/30/21 2052)  cefTRIAXone (ROCEPHIN) 1 g in sodium chloride 0.9 % 100 mL IVPB (0 g Intravenous Stopped 11/30/21 2051)     IMPRESSION / MDM / ASSESSMENT AND PLAN / ED COURSE  I reviewed the triage vital signs and the nursing notes.                              Differential diagnosis includes, but is not limited to, ectopic, DU B, AUB, miscarriage, placental abruption, subchorionic hematoma, cystitis stone, pyelonephritis  Patient presenting to the ER for evaluation of symptoms as described above.  Base on symptoms, risk factors and considered above differential, this presenting complaint could reflect a potentially life-threatening illness therefore the patient will be placed on  continuous pulse oximetry and telemetry for monitoring.  Laboratory evaluation will be sent to evaluate for the above complaints.      Clinical Course as of 11/30/21 2117  Wed Nov 30, 2021  1956 Review of past medical record.  She is Rh+. [PR]  2114 Ultrasound OB on my review and interpretation does confirm IUP.  She remains hemodynamically stable well-appearing tolerating p.o.  Does have UTI but she is not septic.  Was given dose of IV antibiotics as she is pregnant but she now tolerating p.o. appears well and I do believe she stable and appropriate for outpatient follow-up at this point.  We discussed return precautions.  Patient agreeable to plan. [PR]    Clinical Course User Index [PR] Merlyn Lot, MD    FINAL CLINICAL IMPRESSION(S) / ED DIAGNOSES   Final diagnoses:  Vaginal bleeding in pregnancy  Acute cystitis without hematuria     Rx / DC Orders   ED Discharge Orders          Ordered    cephALEXin (KEFLEX) 500 MG capsule  3 times daily        11/30/21 2114             Note:  This document was prepared using Dragon voice recognition software and may include unintentional dictation errors.    Merlyn Lot, MD 11/30/21 2117

## 2021-11-30 NOTE — ED Triage Notes (Signed)
Pt reports abd pain with vaginal bleeding since yesterday.   Pt is pregnant.  Pt denies urinary sx  pt has back pain.  Pt alert  speech clear.

## 2022-02-11 ENCOUNTER — Other Ambulatory Visit: Payer: Self-pay

## 2022-02-11 ENCOUNTER — Observation Stay
Admission: EM | Admit: 2022-02-11 | Discharge: 2022-02-11 | Disposition: A | Discharge status: Home / Self Care | Payer: Commercial Managed Care - HMO | Attending: Obstetrics | Admitting: Obstetrics

## 2022-02-11 DIAGNOSIS — O99891 Other specified diseases and conditions complicating pregnancy: Secondary | ICD-10-CM | POA: Diagnosis not present

## 2022-02-11 DIAGNOSIS — N764 Abscess of vulva: Secondary | ICD-10-CM | POA: Diagnosis not present

## 2022-02-11 DIAGNOSIS — Z3A26 26 weeks gestation of pregnancy: Secondary | ICD-10-CM | POA: Diagnosis not present

## 2022-02-11 DIAGNOSIS — O368120 Decreased fetal movements, second trimester, not applicable or unspecified: Secondary | ICD-10-CM | POA: Insufficient documentation

## 2022-02-11 DIAGNOSIS — O2392 Unspecified genitourinary tract infection in pregnancy, second trimester: Secondary | ICD-10-CM | POA: Insufficient documentation

## 2022-02-11 DIAGNOSIS — O26892 Other specified pregnancy related conditions, second trimester: Secondary | ICD-10-CM | POA: Diagnosis present

## 2022-02-11 DIAGNOSIS — O0932 Supervision of pregnancy with insufficient antenatal care, second trimester: Secondary | ICD-10-CM | POA: Diagnosis not present

## 2022-02-11 DIAGNOSIS — R102 Pelvic and perineal pain: Secondary | ICD-10-CM | POA: Diagnosis not present

## 2022-02-11 DIAGNOSIS — Z5900 Homelessness unspecified: Secondary | ICD-10-CM

## 2022-02-11 HISTORY — DX: Gestational (pregnancy-induced) hypertension without significant proteinuria, unspecified trimester: O13.9

## 2022-02-11 LAB — URINE DRUG SCREEN, QUALITATIVE (ARMC ONLY)
Amphetamines, Ur Screen: NOT DETECTED
Barbiturates, Ur Screen: NOT DETECTED
Benzodiazepine, Ur Scrn: NOT DETECTED
Cannabinoid 50 Ng, Ur ~~LOC~~: POSITIVE — AB
Cocaine Metabolite,Ur ~~LOC~~: NOT DETECTED
MDMA (Ecstasy)Ur Screen: NOT DETECTED
Methadone Scn, Ur: NOT DETECTED
Opiate, Ur Screen: NOT DETECTED
Phencyclidine (PCP) Ur S: NOT DETECTED
Tricyclic, Ur Screen: NOT DETECTED

## 2022-02-11 LAB — URINALYSIS, COMPLETE (UACMP) WITH MICROSCOPIC
Bilirubin Urine: NEGATIVE
Glucose, UA: NEGATIVE mg/dL
Hgb urine dipstick: NEGATIVE
Ketones, ur: NEGATIVE mg/dL
Leukocytes,Ua: NEGATIVE
Nitrite: NEGATIVE
Protein, ur: NEGATIVE mg/dL
Specific Gravity, Urine: 1.017 (ref 1.005–1.030)
pH: 7 (ref 5.0–8.0)

## 2022-02-11 LAB — CBC
HCT: 34.3 % — ABNORMAL LOW (ref 36.0–46.0)
Hemoglobin: 11.3 g/dL — ABNORMAL LOW (ref 12.0–15.0)
MCH: 32.8 pg (ref 26.0–34.0)
MCHC: 32.9 g/dL (ref 30.0–36.0)
MCV: 99.4 fL (ref 80.0–100.0)
Platelets: 204 10*3/uL (ref 150–400)
RBC: 3.45 MIL/uL — ABNORMAL LOW (ref 3.87–5.11)
RDW: 13.7 % (ref 11.5–15.5)
WBC: 10.1 10*3/uL (ref 4.0–10.5)
nRBC: 0 % (ref 0.0–0.2)

## 2022-02-11 LAB — CHLAMYDIA/NGC RT PCR (ARMC ONLY)
Chlamydia Tr: NOT DETECTED
N gonorrhoeae: NOT DETECTED

## 2022-02-11 LAB — WET PREP, GENITAL
Clue Cells Wet Prep HPF POC: NONE SEEN
Sperm: NONE SEEN
Trich, Wet Prep: NONE SEEN
WBC, Wet Prep HPF POC: <10 (ref <10)
Yeast Wet Prep HPF POC: NONE SEEN

## 2022-02-11 LAB — HEPATITIS C ANTIBODY: HCV Ab: NONREACTIVE

## 2022-02-11 LAB — HIV ANTIBODY (ROUTINE TESTING W REFLEX): HIV Screen 4th Generation wRfx: NONREACTIVE

## 2022-02-11 LAB — HEPATITIS B SURFACE ANTIGEN: Hepatitis B Surface Ag: NONREACTIVE

## 2022-02-11 MED ORDER — LIDOCAINE HCL (PF) 1 % IJ SOLN
INTRAMUSCULAR | Status: AC
  Administered 2022-02-11: 10 mL via INTRADERMAL
  Filled 2022-02-11: qty 30

## 2022-02-11 MED ORDER — ONDANSETRON HCL 4 MG/2ML IJ SOLN: 4.0000 mg | Freq: Four times a day (QID) | INTRAMUSCULAR | Status: DC | PRN

## 2022-02-11 MED ORDER — LIDOCAINE HCL 1 % IJ SOLN
10.0000 mL | Freq: Once | INTRAMUSCULAR | Status: AC
  Filled 2022-02-11: qty 10

## 2022-02-11 MED ORDER — LACTATED RINGERS IV SOLN: INTRAVENOUS | Status: DC

## 2022-02-11 MED ORDER — SOD CITRATE-CITRIC ACID 500-334 MG/5ML PO SOLN: 30.0000 mL | ORAL | Status: DC | PRN

## 2022-02-11 MED ORDER — ACETAMINOPHEN 325 MG PO TABS
650.0000 mg | ORAL_TABLET | ORAL | Status: DC | PRN
  Administered 2022-02-11: 650 mg via ORAL
  Filled 2022-02-11: qty 2

## 2022-02-11 MED ORDER — AMOXICILLIN-POT CLAVULANATE 875-125 MG PO TABS: 1.0000 | ORAL_TABLET | Freq: Two times a day (BID) | ORAL | 0 refills | Status: AC

## 2022-02-11 MED ORDER — LACTATED RINGERS IV SOLN: 500.0000 mL | INTRAVENOUS | Status: DC | PRN

## 2022-02-11 MED ORDER — AMOXICILLIN-POT CLAVULANATE 875-125 MG PO TABS
1.0000 | ORAL_TABLET | Freq: Two times a day (BID) | ORAL | Status: DC
  Administered 2022-02-11: 1 via ORAL
  Filled 2022-02-11: qty 1

## 2022-02-11 NOTE — Discharge Instructions (Signed)
Homeless resources   [facility] [address] [place] [address] [phone]  [facility] [address] [place], [place] [address] [phone]
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
## 2022-02-11 NOTE — Progress Notes (Signed)
CSW contacted patient with no answer. CSW attached homeless resources to patients AVS.
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
## 2022-02-11 NOTE — Progress Notes (Signed)
Pt presents to L/D triage with reported mild abdominal cramping and red, swollen unilateral labial pain that began a few days prior rated 7/10 . Pt reports decreased fetal movement.Visual assessment showed unilateral redness and swelling on one labia-CNM noted a cyst.  Pt reports unstable housing and resources at this time.  Pt reports no bleeding or LOF and positive fetal movement. Monitors applied and assessing. VSS.
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
## 2022-02-12 LAB — HSV(HERPES SIMPLEX VRS) I + II AB-IGG
HSV 1 Glycoprotein G Ab, IgG: 17.5 index — ABNORMAL HIGH (ref 0.00–0.90)
HSV 2 Glycoprotein G Ab, IgG: <0.91 index (ref 0.00–0.90)

## 2022-02-12 LAB — RPR: RPR Ser Ql: NONREACTIVE

## 2022-02-12 LAB — HEPATITIS B SURFACE ANTIBODY, QUANTITATIVE: Hep B S AB Quant (Post): 326.9 m[IU]/mL (ref >9.9)

## 2022-02-14 ENCOUNTER — Encounter: Payer: Self-pay | Admitting: Obstetrics and Gynecology

## 2022-02-14 ENCOUNTER — Ambulatory Visit (INDEPENDENT_AMBULATORY_CARE_PROVIDER_SITE_OTHER): Payer: Commercial Managed Care - HMO | Admitting: Obstetrics and Gynecology

## 2022-02-14 ENCOUNTER — Other Ambulatory Visit (HOSPITAL_COMMUNITY)
Admission: RE | Admit: 2022-02-14 | Discharge: 2022-02-14 | Disposition: A | Discharge status: Home / Self Care | Payer: Commercial Managed Care - HMO | Source: Ambulatory Visit | Attending: Obstetrics and Gynecology | Admitting: Obstetrics and Gynecology

## 2022-02-14 VITALS — BP 123/74 | HR 92 | Resp 16 | Ht 64.0 in | Wt 162.3 lb

## 2022-02-14 DIAGNOSIS — O09299 Supervision of pregnancy with other poor reproductive or obstetric history, unspecified trimester: Secondary | ICD-10-CM

## 2022-02-14 DIAGNOSIS — O99891 Other specified diseases and conditions complicating pregnancy: Secondary | ICD-10-CM

## 2022-02-14 DIAGNOSIS — O34219 Maternal care for unspecified type scar from previous cesarean delivery: Secondary | ICD-10-CM

## 2022-02-14 DIAGNOSIS — O0932 Supervision of pregnancy with insufficient antenatal care, second trimester: Secondary | ICD-10-CM | POA: Diagnosis not present

## 2022-02-14 DIAGNOSIS — Z124 Encounter for screening for malignant neoplasm of cervix: Secondary | ICD-10-CM

## 2022-02-14 DIAGNOSIS — O99332 Smoking (tobacco) complicating pregnancy, second trimester: Secondary | ICD-10-CM

## 2022-02-14 DIAGNOSIS — F17210 Nicotine dependence, cigarettes, uncomplicated: Secondary | ICD-10-CM

## 2022-02-14 DIAGNOSIS — Z8759 Personal history of other complications of pregnancy, childbirth and the puerperium: Secondary | ICD-10-CM

## 2022-02-14 DIAGNOSIS — Z22322 Carrier or suspected carrier of Methicillin resistant Staphylococcus aureus: Secondary | ICD-10-CM

## 2022-02-14 DIAGNOSIS — O09292 Supervision of pregnancy with other poor reproductive or obstetric history, second trimester: Secondary | ICD-10-CM | POA: Diagnosis not present

## 2022-02-14 DIAGNOSIS — Z3A26 26 weeks gestation of pregnancy: Secondary | ICD-10-CM | POA: Diagnosis present

## 2022-02-14 DIAGNOSIS — Z720 Tobacco use: Secondary | ICD-10-CM

## 2022-02-14 DIAGNOSIS — O0992 Supervision of high risk pregnancy, unspecified, second trimester: Secondary | ICD-10-CM

## 2022-02-14 DIAGNOSIS — Z131 Encounter for screening for diabetes mellitus: Secondary | ICD-10-CM

## 2022-02-14 DIAGNOSIS — Z3482 Encounter for supervision of other normal pregnancy, second trimester: Secondary | ICD-10-CM | POA: Diagnosis present

## 2022-02-14 DIAGNOSIS — O0972 Supervision of high risk pregnancy due to social problems, second trimester: Secondary | ICD-10-CM

## 2022-02-14 DIAGNOSIS — Z98891 History of uterine scar from previous surgery: Secondary | ICD-10-CM

## 2022-02-14 DIAGNOSIS — A4902 Methicillin resistant Staphylococcus aureus infection, unspecified site: Secondary | ICD-10-CM

## 2022-02-14 DIAGNOSIS — Z1379 Encounter for other screening for genetic and chromosomal anomalies: Secondary | ICD-10-CM

## 2022-02-14 DIAGNOSIS — O98812 Other maternal infectious and parasitic diseases complicating pregnancy, second trimester: Secondary | ICD-10-CM

## 2022-02-14 DIAGNOSIS — B009 Herpesviral infection, unspecified: Secondary | ICD-10-CM

## 2022-02-14 DIAGNOSIS — Z8659 Personal history of other mental and behavioral disorders: Secondary | ICD-10-CM

## 2022-02-14 DIAGNOSIS — N764 Abscess of vulva: Secondary | ICD-10-CM

## 2022-02-14 DIAGNOSIS — O99012 Anemia complicating pregnancy, second trimester: Secondary | ICD-10-CM

## 2022-02-14 DIAGNOSIS — Z8742 Personal history of other diseases of the female genital tract: Secondary | ICD-10-CM

## 2022-02-14 MED ORDER — SERTRALINE HCL 50 MG PO TABS: ORAL_TABLET | ORAL | 6 refills | Status: DC

## 2022-02-14 NOTE — Progress Notes (Signed)
OBSTETRIC INITIAL PRENATAL VISIT  Subjective:    Cynthia Coffey is being seen today for her first obstetrical visit.  This is a planned pregnancy. She is a 31 y.o. [id number] female at [redacted]w[redacted]d gestation, Estimated Date of Delivery: 05/17/22 with Patient's last menstrual period was 08/10/2021 (approximate).,  consistent with 17 week sono. Her obstetrical history is significant for  late prenatal care, social issues (lack of transportation, transitional housing, anxiety), MRSA colonizer,h/o pregnancy induced HTN in a prior pregnancy, IUGR in last pregnancy, history of C/Section x 1 . Relationship with FOB: significant other, not living together (different FOB than previous children). Patient has intents to breast feed. Pregnancy history fully reviewed.  Of note patient was seen in triage on 02/11/2022 due to complaints of swollen painful labia and decreased fetal movement. Was also concerned for STIs.  Labial abscess was noted and I&D performed. Was started on Augmentin.    OB History  Gravida Para Term Preterm AB Living  4 3 3  0 0 3  SAB IAB Ectopic Multiple Live Births  0 0 0 0 3    # Outcome Date GA Lbr Len/2nd Weight Sex Delivery Anes PTL Lv  4 Current           3 Term 06/10/17 [redacted]w[redacted]d  5 lb 10 oz (2.55 kg) F CS-LTranv Spinal  LIV     Name: [name]     Apgar1: 8  Apgar5: 9  2 Term 11/17/09   7 lb 12 oz (3.515 kg) F Vag-Spont   LIV     Name: [name]  1 Term 04/20/06   8 lb (3.629 kg) M Vag-Spont   LIV     Name: [name]    Gynecologic History:  Last pap smear was 06/2017.  Results were  Abnormal - LGSIL, HPV+ .  Reports h/o abnormal pap smears in the past (ASCUS HR HPV+ pap in 2018).    Denies history of STIs.  Contraception prior to conception: None   Past Medical History:  Diagnosis Date   Medical history non-contributory    Pregnancy induced hypertension     Family History  Problem Relation Age of Onset   Diabetes Paternal Grandfather    Throat cancer Paternal Grandmother      Past Surgical History:  Procedure Laterality Date   CESAREAN SECTION N/A 06/10/2017   Procedure: CESAREAN SECTION;  Surgeon: Vena Austria, MD;  Location: [facility] ORS;  Service: Obstetrics;  Laterality: N/A;    Social History   Socioeconomic History   Marital status: Single    Spouse name: Not on file   Number of children: Not on file   Years of education: Not on file   Highest education level: Not on file  Occupational History   Not on file  Tobacco Use   Smoking status: Every Day    Types: E-cigarettes   Smokeless tobacco: Never  Vaping Use   Vaping Use: Every day  Substance and Sexual Activity   Alcohol use: Not Currently   Drug use: Not Currently    Types: Marijuana   Sexual activity: Yes    Comment: unsure  Other Topics Concern   Not on file  Social History Narrative   Not on file   Social Determinants of Health   Financial Resource Strain: Not on file  Food Insecurity: Food Insecurity Present (02/15/2022)   Hunger Vital Sign    Worried About Running Out of Food in the Last Year: Often true    Ran Out of Food in  the Last Year: Often true  Transportation Needs: Unmet Transportation Needs (02/15/2022)   PRAPARE - Administrator, Civil Service (Medical): Yes    Lack of Transportation (Non-Medical): Yes  Physical Activity: Not on file  Stress: Not on file  Social Connections: Not on file  Intimate Partner Violence: Not on file    Current Outpatient Medications on File Prior to Visit  Medication Sig Dispense Refill   acetaminophen (TYLENOL) 500 MG tablet Take 500 mg by mouth every 6 (six) hours as needed.     amoxicillin-clavulanate (AUGMENTIN) 875-125 MG tablet Take 1 tablet by mouth 2 (two) times daily for 7 days. 14 tablet 0   Prenatal Vit-Fe Fumarate-FA (MULTIVITAMIN-PRENATAL) 27-0.8 MG TABS tablet Take 1 tablet by mouth daily at 12 noon.     citalopram (CELEXA) 20 MG tablet Take 1 tablet (20 mg total) by mouth daily. (Patient not taking:  Reported on 02/11/2022) 30 tablet 2   Ferrous Sulfate (IRON PO) Take 1 tablet by mouth daily. (Patient not taking: Reported on 02/14/2022)     No current facility-administered medications on file prior to visit.    No Known Allergies   Review of Systems General: Not Present- Fever, Weight Loss and Weight Gain. Skin: Not Present- Rash. HEENT: Not Present- Blurred Vision, Headache and Bleeding Gums. Respiratory: Not Present- Difficulty Breathing. Breast: Not Present- Breast Mass. Cardiovascular: Not Present- Chest Pain, Elevated Blood Pressure, Fainting / Blacking Out and Shortness of Breath. Gastrointestinal: Not Present- Abdominal Pain, Constipation, Nausea and Vomiting. Female Genitourinary: Not Present- Frequency, Painful Urination, Pelvic Pain, Vaginal Bleeding, Vaginal Discharge, Contractions, regular, Fetal Movements Decreased, Urinary Complaints and Vaginal Fluid. Left Labial pain present.  Musculoskeletal: Not Present- Back Pain and Leg Cramps. Neurological: Not Present- Dizziness. Psychiatric: Not Present- Depression.  Present- Anxiety    Objective:   .Blood pressure 123/74, pulse 92, resp. rate 16, height 5\' 4"  (1.626 m), weight 162 lb 4.8 oz (73.6 kg), last menstrual period 08/10/2021.   Body mass index is 27.86 kg/m.  General Appearance:    Alert, cooperative, no distress, appears stated age, overweight  Head:    Normocephalic, without obvious abnormality, atraumatic  Eyes:    PERRL, conjunctiva/corneas clear, EOM's intact, both eyes  Ears:    Normal external ear canals, both ears  Nose:   Nares normal, septum midline, mucosa normal, no drainage or sinus tenderness  Throat:   Lips, mucosa, and tongue normal; teeth and gums normal  Neck:   Supple, symmetrical, trachea midline, no adenopathy; thyroid: no enlargement/tenderness/nodules; no carotid bruit or JVD  Back:     Symmetric, no curvature, ROM normal, no CVA tenderness  Lungs:     Clear to auscultation bilaterally,  respirations unlabored  Chest Wall:    No tenderness or deformity   Heart:    Regular rate and rhythm, S1 and S2 normal, no murmur, rub or gallop  Breast Exam:    No tenderness, masses, or nipple abnormality  Abdomen:     Soft, non-tender, bowel sounds active all four quadrants, no masses, no organomegaly.  FHT 142  bpm.  Genitalia:    Pelvic:external genitalia normal, vagina without lesions, discharge, or tenderness, rectovaginal septum  normal. Cervix normal in appearance, no cervical motion tenderness, no adnexal masses or tenderness.  Pregnancy positive findings: uterine enlargement: 26 wk size, nontender.   Rectal:    Normal external sphincter.  No hemorrhoids appreciated. Internal exam not done.   Extremities:   Extremities normal, atraumatic, no cyanosis or edema  Pulses:   2+ and symmetric all extremities  Skin:   Skin color, texture, turgor normal, no rashes or lesions  Lymph nodes:   Cervical, supraclavicular, and axillary nodes normal  Neurologic:   CNII-XII intact, normal strength, sensation and reflexes throughout     Assessment:   1. Supervision of high risk pregnancy in second trimester   2. Suprvsn of high risk preg due to social problems, second tri   3. [redacted] weeks gestation of pregnancy   4. Genetic screening   5. History of abnormal cervical Pap smear   6. Screening for diabetes mellitus (DM)   7. Late prenatal care affecting pregnancy in second trimester   8. History of intrauterine growth restriction in prior pregnancy, currently pregnant   9. History of cesarean delivery   10. Labial abscess   11. History of anxiety   12. 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 MRSA (methicillin resistant staph aureus) culture positive   15. Tobacco abuse      Plan:   Supervision of high risk  pregnancy  - Initial labs reviewed. - Prenatal vitamins encouraged. - Problem list reviewed and updated. - New OB counseling:   The patient has been given an overview regarding routine prenatal care.  Recommendations regarding diet, weight gain, and exercise in pregnancy were given. - Prenatal testing, optional genetic testing, and ultrasound use in pregnancy were reviewed.  Traditional genetic screening vs cell-fee DNA genetic screening discussed, including risks and benefits. Testing ordered, [facility]. - Benefits of Breast Feeding were discussed. The patient is encouraged to consider nursing her baby post partum. - Anatomy scan ordered - The patient has Medicaid.  [facility] Medicaid Risk Screening Form completed today  2. Social issues in pregnancy - Notes housing and transportation insecurities.  Will have patient followed by SW.   3. History of abnormal cervical Pap smear LGSIL pap smear noted in 2019 without follow up.  Repeat pap today. Will likely need colposcopy postpartum.   4. Screening for diabetes mellitus (DM) - Glucose, 1 hour ordered today  5. Late prenatal care affecting pregnancy in second trimester Patient without a particular reason for seeking prenatal care late. However does cite some of her soca  6. History of intrauterine growth restriction in prior pregnancy, currently pregnant - Will follow growth closely during the pregnancy.   7. History of cesarean delivery - Patient with C-section in previous delivery, for fetal indications (NRFHT). Desires TOLAC  8. Labial abscess - S/p I&D several days ago.  Was started on Augmentin.  Culture returned with MRSA, will change antibiotic to Bactrim DS.   9. History of anxiety - Patient reports history of anxiety, was started on Celexa some time ago, but notes that she never knew that she had a prescription. Discussed different options for management during pregnancy. Will start on Zoloft.  Prescription given. GAD screening positive today.   10. History of pregnancy induced hypertension - Review of chart notes gestational HTN in her history, however  patient does not recall having this. Is late in pregnancy to begin baby aspirin.   11. HSV-1 (herpes simplex virus 1) infection - Recently diagnosed on StD screen. Consider HSV prophylaxis   12. MRSA (methicillin resistant staph aureus) culture positive - Will change antibiotic to Bactrim.   13. Tobacco abuse - Patient notes that she has cut down on smoking since discovery of pregnancy. Continued to encourage weaning.   Follow up in 2 weeks.  Hildred Laser, MD [facility]

## 2022-02-14 NOTE — Progress Notes (Unsigned)
   Patient presents for NOB visit. Also following up from ER visit on 02/11/2022.    Cynthia Coffey, CMA [facility]

## 2022-02-15 ENCOUNTER — Encounter: Payer: Self-pay | Admitting: Obstetrics and Gynecology

## 2022-02-15 DIAGNOSIS — B009 Herpesviral infection, unspecified: Secondary | ICD-10-CM | POA: Insufficient documentation

## 2022-02-15 DIAGNOSIS — Z8759 Personal history of other complications of pregnancy, childbirth and the puerperium: Secondary | ICD-10-CM | POA: Insufficient documentation

## 2022-02-15 DIAGNOSIS — Z98891 History of uterine scar from previous surgery: Secondary | ICD-10-CM | POA: Insufficient documentation

## 2022-02-15 DIAGNOSIS — Z22322 Carrier or suspected carrier of Methicillin resistant Staphylococcus aureus: Secondary | ICD-10-CM | POA: Insufficient documentation

## 2022-02-15 DIAGNOSIS — Z8659 Personal history of other mental and behavioral disorders: Secondary | ICD-10-CM | POA: Insufficient documentation

## 2022-02-15 DIAGNOSIS — Z8742 Personal history of other diseases of the female genital tract: Secondary | ICD-10-CM | POA: Insufficient documentation

## 2022-02-15 LAB — ABO AND RH: Rh Factor: POSITIVE

## 2022-02-15 LAB — PARVOVIRUS B19 ANTIBODY, IGG AND IGM
Parvovirus B19 IgG: 2.5 index — ABNORMAL HIGH (ref 0.0–0.8)
Parvovirus B19 IgM: 0.2 index (ref 0.0–0.8)

## 2022-02-15 LAB — GLUCOSE, 1 HOUR GESTATIONAL: Gestational Diabetes Screen: 86 mg/dL (ref 70–139)

## 2022-02-15 LAB — VARICELLA ZOSTER ANTIBODY, IGG: Varicella zoster IgG: 682 index (ref >165)

## 2022-02-15 LAB — ANTIBODY SCREEN: Antibody Screen: NEGATIVE

## 2022-02-15 LAB — RUBELLA SCREEN: Rubella Antibodies, IGG: 6.39 index (ref >0.99)

## 2022-02-16 ENCOUNTER — Encounter: Payer: Self-pay | Admitting: Obstetrics and Gynecology

## 2022-02-16 DIAGNOSIS — Z720 Tobacco use: Secondary | ICD-10-CM | POA: Insufficient documentation

## 2022-02-16 LAB — NICOTINE SCREEN, URINE: Cotinine Ql Scrn, Ur: NEGATIVE ng/mL

## 2022-02-16 LAB — AEROBIC/ANAEROBIC CULTURE W GRAM STAIN (SURGICAL/DEEP WOUND): Gram Stain: NONE SEEN

## 2022-02-16 LAB — CULTURE, OB URINE

## 2022-02-16 LAB — URINE CULTURE, OB REFLEX

## 2022-02-16 MED ORDER — SULFAMETHOXAZOLE-TRIMETHOPRIM 800-160 MG PO TABS: 1.0000 | ORAL_TABLET | Freq: Two times a day (BID) | ORAL | 0 refills | Status: DC

## 2022-02-16 NOTE — Patient Instructions (Signed)

## 2022-02-17 ENCOUNTER — Telehealth: Payer: Self-pay

## 2022-02-17 DIAGNOSIS — N764 Abscess of vulva: Secondary | ICD-10-CM

## 2022-02-17 MED ORDER — SULFAMETHOXAZOLE-TRIMETHOPRIM 800-160 MG PO TABS: 1.0000 | ORAL_TABLET | Freq: Two times a day (BID) | ORAL | 0 refills | Status: DC

## 2022-02-17 NOTE — Telephone Encounter (Signed)
Pt needs abx sent to [facility] in [place] due to transportation issues. I called [facility] in [place] to see if they can just transfer the Rx and I was advised transfer request had been sent and no response from [facility] in [place]. Rx resent and pt is aware.

## 2022-02-22 LAB — PANORAMA PRENATAL TEST FULL PANEL:PANORAMA TEST PLUS 5 ADDITIONAL MICRODELETIONS: FETAL FRACTION: 15

## 2022-02-27 NOTE — L&D Delivery Note (Signed)
Date of delivery: 05/23/22 Estimated Date of Delivery: 05/17/22 EGA: [redacted]w[redacted]d  Hospital Course summary: Cynthia Coffey is a 32 y.o. [id number] @ [redacted]w[redacted]d who was admitted on 05/23/2022 in active labor.   Delivery Note At 6:47 AM a viable female was delivered via Vaginal, Spontaneous (Presentation: Left Occiput Anterior).  APGAR: 2, 8; weight  .   Placenta status: Spontaneous, Intact.  Cord: 3 vessels with the following complications: precipitous birth, nuchal cord, shoulder dystocia  Anesthesia: None Episiotomy: None Lacerations: None Est. Blood Loss (mL): 100  Mom to postpartum.  Baby to NICU.  Maggie Font 05/23/2022, 7:28 AM    Delivery Narrative  Cynthia Coffey was complete at anterior lip on arrival and began pushing at immediately. I was called down to the ER for delivery as birth was precipitous. Upon walking into the ER room the baby had just been born and was lying on mother's chest. Cynthia GuptillRN caught the baby, she reports reduces a nuchal cord and having a shoulder dystocia which lasted less than one minute and was reduced by bringing Cynthia Coffey's legs towards her torso. Meconium was present on arrival.  . She delivered a female infant named Cynthia Coffey at [phone]. Apgars 2, 8. Baby was immediately placed skin to skin on mother's chest. Placenta was delivered at 0650, primarily by maternal efforts and with some slight cord traction. Placenta was intact, Shultz mechanism, 3 VC. Perineum inspected and found to be intact. AMSTL with IM pitocin as IV had become dislodged. EBL 100. Baby brought upstairs to NICU for monitoring and mother brought to labor and delivery.

## 2022-03-06 ENCOUNTER — Ambulatory Visit (INDEPENDENT_AMBULATORY_CARE_PROVIDER_SITE_OTHER): Payer: Commercial Managed Care - HMO

## 2022-03-06 DIAGNOSIS — O0972 Supervision of high risk pregnancy due to social problems, second trimester: Secondary | ICD-10-CM

## 2022-03-06 DIAGNOSIS — Z3482 Encounter for supervision of other normal pregnancy, second trimester: Secondary | ICD-10-CM

## 2022-03-06 DIAGNOSIS — Z3A26 26 weeks gestation of pregnancy: Secondary | ICD-10-CM

## 2022-03-06 DIAGNOSIS — O0992 Supervision of high risk pregnancy, unspecified, second trimester: Secondary | ICD-10-CM

## 2022-03-06 LAB — CYTOLOGY - PAP
Comment: NEGATIVE
Comment: NEGATIVE
Comment: NEGATIVE
HPV 16: POSITIVE — AB
HPV 18 / 45: POSITIVE — AB
High risk HPV: POSITIVE — AB

## 2022-03-07 ENCOUNTER — Encounter: Payer: Commercial Managed Care - HMO | Admitting: Advanced Practice Midwife

## 2022-03-07 ENCOUNTER — Telehealth: Payer: Self-pay | Admitting: Advanced Practice Midwife

## 2022-03-07 MED ORDER — FLUCONAZOLE 150 MG PO TABS: 150.0000 mg | ORAL_TABLET | Freq: Once | ORAL | 0 refills | Status: AC

## 2022-03-07 NOTE — Addendum Note (Signed)
Addended by: Chilton Greathouse on: 03/07/2022 09:13 AM   Modules accepted: Orders

## 2022-03-07 NOTE — Telephone Encounter (Signed)
Reached out to pt to reschedule ROB appt that was scheduled for 03/07/2022 at 3:55 with JEG.  Call could not be completed.

## 2022-03-08 ENCOUNTER — Encounter: Payer: Self-pay | Admitting: Advanced Practice Midwife

## 2022-03-08 NOTE — Telephone Encounter (Signed)
Reached out to pt to reschedule ROB appt that was scheduled for 03/07/22 at 3:55 with JEG.  Call could not be completed.  Will send a Missing Appt letter to the pt.

## 2022-04-07 ENCOUNTER — Ambulatory Visit: Payer: Commercial Managed Care - HMO

## 2022-04-07 ENCOUNTER — Ambulatory Visit (INDEPENDENT_AMBULATORY_CARE_PROVIDER_SITE_OTHER): Payer: Commercial Managed Care - HMO | Admitting: Licensed Practical Nurse

## 2022-04-07 ENCOUNTER — Encounter: Payer: Self-pay | Admitting: Licensed Practical Nurse

## 2022-04-07 VITALS — BP 125/87 | HR 114 | Wt 174.5 lb

## 2022-04-07 VITALS — BP 125/87 | HR 114 | Ht 64.0 in | Wt 174.0 lb

## 2022-04-07 DIAGNOSIS — Z8659 Personal history of other mental and behavioral disorders: Secondary | ICD-10-CM

## 2022-04-07 DIAGNOSIS — Z3A34 34 weeks gestation of pregnancy: Secondary | ICD-10-CM

## 2022-04-07 DIAGNOSIS — O368130 Decreased fetal movements, third trimester, not applicable or unspecified: Secondary | ICD-10-CM

## 2022-04-07 DIAGNOSIS — O0972 Supervision of high risk pregnancy due to social problems, second trimester: Secondary | ICD-10-CM

## 2022-04-07 MED ORDER — SERTRALINE HCL 50 MG PO TABS: ORAL_TABLET | ORAL | 6 refills | Status: DC

## 2022-04-07 NOTE — Progress Notes (Signed)
    NURSE VISIT NOTE  Subjective:    Cynthia Coffey ID: Cynthia Coffey, female    DOB: [birth year], 32 y.o.   MRN: [id number]  HPI  Cynthia Coffey is a 32 y.o. [id number] female who presents for fetal monitoring per order from Roberto Scales, [place].   Objective:    Ht 5\' 4"  (1.626 m)   LMP 08/10/2021 (Approximate)   BMI 29.95 kg/m  Estimated Date of Delivery: 05/17/22  Assessment:   1. Decreased fetal movements in third trimester, single or unspecified fetus   2. [redacted] weeks gestation of pregnancy      Plan:   Results reviewed and discussed with Cynthia Coffey by  Roberto Scales, CNM.     Otila Kluver, LPN

## 2022-04-07 NOTE — Progress Notes (Signed)
Routine Prenatal Care Visit  Subjective  Cynthia Coffey is a 32 y.o. [id number] at 80w2dbeing seen today for ongoing prenatal care.  She is currently monitored for the following issues for this high-risk pregnancy and has Breast lump on right side at 10 o'clock position; Breast lump on left side at 3 o'clock position; Suprvsn of high risk preg due to social problems, second tri; ASCUS with positive high risk HPV cervical; Postpartum fever; Acute cystitis without hematuria; Substance use disorder; Labor and delivery, indication for care; Late prenatal care affecting pregnancy in second trimester; History of intrauterine growth restriction in prior pregnancy, currently pregnant; MRSA (methicillin resistant staph aureus) culture positive; HSV-1 (herpes simplex virus 1) infection; History of pregnancy induced hypertension; History of anxiety; History of cesarean delivery; History of abnormal cervical Pap smear; Tobacco abuse; Decreased fetal movements in third trimester; and [redacted] weeks gestation of pregnancy on their problem list.  ----------------------------------------------------------------------------------- Patient reports  pt reports feeling miserable-both with general discomforts of pregnancy and mood, does not want to get out of bed, feels down. Has not picked up her prescription for Zoloft. [facility] [place] to start.  .    -Reports not feeling the baby moved, RNST done  -Unsure about housing in the future, not sure where she will go once the baby is born. Trying to get in with [facility].  Not sure who will be with her in labor, she really wants her 2 kids to be there they are 89and 89 reviewed hospital policy.  -Desires TOLAC  Contractions: Not present. Vag. Bleeding: None.  Movement: Present. Leaking Fluid denies.  ----------------------------------------------------------------------------------- The following portions of the patient's history were  reviewed and updated as appropriate: allergies, current medications, past family history, past medical history, past social history, past surgical history and problem list. Problem list updated.  Objective  Blood pressure 125/87, pulse (!) 114, weight 174 lb 8 oz (79.2 kg), last menstrual period 08/10/2021. Pregravid weight 148 lb (67.1 kg) Total Weight Gain 26 lb 8 oz (12 kg) Urinalysis: Urine Protein    Urine Glucose    Fetal Status: Fetal Heart Rate (bpm): 135 Fundal Height: 34 cm Movement: Present     General:  Alert, oriented and cooperative. Patient is in no acute distress.  Skin: Skin is warm and dry. No rash noted.   Cardiovascular: Normal heart rate noted  Respiratory: Normal respiratory effort, no problems with respiration noted  Abdomen: Soft, gravid, appropriate for gestational age. Pain/Pressure: Present     Pelvic:  Cervical exam deferred        Extremities: Normal range of motion.  Edema: None  Mental Status: Normal mood and affect. Normal behavior. Normal judgment and thought content.   Assessment   32y.o. [id number]  05/17/2022, by Last Menstrual Period presenting for routine prenatal visit  Plan   G4 Problems (from 02/11/22 to present)     No problems associated with this episode.        Preterm labor symptoms and general obstetric precautions including but not limited to vaginal bleeding, contractions, leaking of fluid and fetal movement were reviewed in detail with the patient. Please refer to After Visit Summary for other counseling recommendations.   Return in about 1 week (around 04/14/2022) for [facility] An[place]orayne Bende[place]aged to reach out to pt  Pt did not leave urine sample, needs UDS when able.   LyRoberto ScalesCNCentervilleedical Group  04/07/22  5:30 PM

## 2022-04-13 ENCOUNTER — Encounter: Payer: Self-pay | Admitting: Licensed Practical Nurse

## 2022-04-18 ENCOUNTER — Encounter: Payer: Self-pay | Admitting: Advanced Practice Midwife

## 2022-04-18 ENCOUNTER — Other Ambulatory Visit (HOSPITAL_COMMUNITY)
Admission: RE | Admit: 2022-04-18 | Discharge: 2022-04-18 | Disposition: A | Discharge status: Home / Self Care | Payer: Commercial Managed Care - HMO | Source: Ambulatory Visit | Attending: Advanced Practice Midwife | Admitting: Advanced Practice Midwife

## 2022-04-18 ENCOUNTER — Ambulatory Visit (INDEPENDENT_AMBULATORY_CARE_PROVIDER_SITE_OTHER): Payer: Commercial Managed Care - HMO | Admitting: Advanced Practice Midwife

## 2022-04-18 VITALS — BP 134/80 | HR 112 | Wt 175.0 lb

## 2022-04-18 DIAGNOSIS — Z113 Encounter for screening for infections with a predominantly sexual mode of transmission: Secondary | ICD-10-CM | POA: Diagnosis present

## 2022-04-18 DIAGNOSIS — O0973 Supervision of high risk pregnancy due to social problems, third trimester: Secondary | ICD-10-CM | POA: Insufficient documentation

## 2022-04-18 DIAGNOSIS — Z3685 Encounter for antenatal screening for Streptococcus B: Secondary | ICD-10-CM

## 2022-04-18 DIAGNOSIS — Z3A35 35 weeks gestation of pregnancy: Secondary | ICD-10-CM

## 2022-04-18 DIAGNOSIS — Z23 Encounter for immunization: Secondary | ICD-10-CM

## 2022-04-18 DIAGNOSIS — O09893 Supervision of other high risk pregnancies, third trimester: Secondary | ICD-10-CM

## 2022-04-18 DIAGNOSIS — Z59819 Housing instability, housed unspecified: Secondary | ICD-10-CM

## 2022-04-18 NOTE — Patient Instructions (Signed)
Vaginal Birth After Cesarean Delivery  Vaginal birth after cesarean delivery (VBAC) means giving birth vaginally after previously delivering a baby through a cesarean section, or C-section. VBAC may be a safe option for you, depending on your health and other factors. Discuss VBAC with your health care provider early in your pregnancy, so you can understand the risks, benefits, and options. Having these discussions early will give you time to make your birth plan. What increases the chances for a successful VBAC? These factors increase your chances of a successful VBAC: You have had only one previous C-section. You had a low transverse incision for your C-section. You have had a successful vaginal birth. Your labor starts naturally on or before your due date. You and your baby have had a healthy pregnancy. Your baby is head-down. What happens when I arrive at the birth center or hospital? Once you are in labor and have been admitted into the hospital or birth center, your health care team may: Review your pregnancy history and go over any concerns you have. Talk with you about your birth plan and discuss pain control options. Check your blood pressure, breathing rate, and heart rate. Check your baby's heartbeat. Monitor your uterus for contractions. Check whether your bag of water (amniotic sac) has broken (ruptured). Insert an IV into one of your veins. You may get fluids and medicine through the IV. Monitoring and exams Your health care team may check your contractions (uterine monitoring) and your baby's heart rate (fetal monitoring). You may need to be monitored for long periods at a time (continuously) with a monitoring device. Your health care provider may also do frequent physical exams. These may include checking how and where your baby is positioned in your uterus and checking your cervix to determine whether it is opening up (dilating) or thinning out (effacing). What happens during  labor and delivery? Normal labor and delivery are divided into the following three stages: Stage 1 This is the longest stage of labor. Throughout this stage, you will feel contractions. Contractions are generally mild, infrequent, and irregular at first. They get stronger, more frequent (about every 2-3 minutes), and more regular as you move through this stage. The first stage ends when your cervix is completely dilated to 4 inches (10 cm) and effaced. Stage 2 This stage starts once your cervix is completely dilated and effaced and lasts until you deliver your baby. In this stage, you will feel the urge to push your baby out of your vagina. You may feel stretching and burning pain, especially when the widest part of your baby's head passes through the vaginal opening (crowning). Once your baby is delivered, the umbilical cord will be clamped and cut. Your baby will be placed on your bare chest and covered with a warm blanket. If you are planning to breastfeed, watch your baby for feeding cues, like rooting or sucking. Stage 3 This stage starts immediately after your baby is born and ends after you deliver the placenta. This stage may take anywhere from 5 to 30 minutes. What can I expect after labor and delivery? After labor is over, you and your baby will be checked to make sure you are both healthy, and you will both be monitored until you are ready to go home. You may be encouraged to stay in the same room with your baby to promote early bonding and successful breastfeeding. Your uterus will be checked and massaged regularly (fundal massage). You may continue to receive fluids and medicines  through an IV. You will have some soreness and pain in your abdomen, vagina, and the area of skin between your vaginal opening and your anus (perineum). If an incision was made near your vagina (episiotomy) or if you had some vaginal tearing during delivery, cold compresses may be used to reduce pain and  swelling. Follow these instructions at home: Incision care If you have an episiotomy incision, follow instructions from your health care provider about how to take care of your incision. Check your incision area every day for signs of infection. Check for: Redness, swelling, or pain. More fluid or blood. Warmth. Pus or a bad smell. If directed, put ice on the episiotomy area. To do this: Put ice in a plastic bag. Place a towel between your skin and the bag. Leave the ice on for 20 minutes, 2-3 times a day. Remove the ice if your skin turns bright red. This is very important. If you cannot feel pain, heat, or cold, you have a greater risk of damage to the area. Activity Return to your normal activities as told by your health care provider. Ask your health care provider what activities are safe for you. Avoid sitting for a long time without moving. Get up to take short walks every 1-2 hours. General instructions Take over-the-counter and prescription medicines only as told by your health care provider. Do not take baths, swim, or use a hot tub until your health care provider approves. Ask if you may take showers. You may only be allowed to take sponge baths. It is normal to have vaginal bleeding after delivery. Wear a sanitary pad for vaginal bleeding and discharge. Keep all follow-up visits. This is important. Where to find more information American Pregnancy Association: americanpregnancy.org [employer]st[employer]ians [employer] Gynecologists: acog.org Summary Vaginal birth after cesarean delivery (VBAC) means giving birth vaginally after previously delivering a baby through a cesarean section, or C-section. Once you are in labor and have been admitted into the hospital or birth center, your health care team may review your pregnancy history and go over any concerns you have. Although most women are able to have a successful VBAC, sometimes it is necessary to have another  C-section. Keep all follow-up visits. This is important. This information is not intended to replace advice given to you by your health care provider. Make sure you discuss any questions you have with your health care provider. Document Revised: 02/12/2020 Document Reviewed: 02/12/2020 Elsevier Patient Education  [employer].

## 2022-04-18 NOTE — Progress Notes (Signed)
Routine Prenatal Care Visit  Subjective  Cynthia Coffey is a 32 y.o. [id number] at 10w6dbeing seen today for ongoing prenatal care.  She is currently monitored for the following issues for this high-risk pregnancy and has Suprvsn of high risk preg due to social problems, second tri; ASCUS with positive high risk HPV cervical; Substance use disorder; Late prenatal care affecting pregnancy in second trimester; History of intrauterine growth restriction in prior pregnancy, currently pregnant; MRSA (methicillin resistant staph aureus) culture positive; HSV-1 (herpes simplex virus 1) infection; History of pregnancy induced hypertension; History of anxiety; History of cesarean delivery; History of abnormal cervical Pap smear; Tobacco abuse; Decreased fetal movements in third trimester; and Housing insecurity on their problem list.  ----------------------------------------------------------------------------------- Patient reports doing well. Advised she see MD at next visit to sign VBAC consent. Occasional urine leakage.   Contractions: Not present. Vag. Bleeding: None.  Movement: Present. Leaking Fluid denies.  ----------------------------------------------------------------------------------- The following portions of the patient's history were reviewed and updated as appropriate: allergies, current medications, past family history, past medical history, past social history, past surgical history and problem list. Problem list updated.  Objective  Blood pressure 134/80, pulse (!) 112, weight 175 lb (79.4 kg), last menstrual period 08/10/2021. Pregravid weight 148 lb (67.1 kg) Total Weight Gain 27 lb (12.2 kg) Urinalysis: Urine Protein    Urine Glucose    Fetal Status: Fetal Heart Rate (bpm): 138 Fundal Height: 36 cm Movement: Present     General:  Alert, oriented and cooperative. Patient is in no acute distress.  Skin: Skin is warm and dry. No rash noted.   Cardiovascular: Normal heart rate noted   Respiratory: Normal respiratory effort, no problems with respiration noted  Abdomen: Soft, gravid, appropriate for gestational age. Pain/Pressure: Present     Pelvic:   Self swabbed aptima/GBS         Extremities: Normal range of motion.  Edema: None  Mental Status: Normal mood and affect. Normal behavior. Normal judgment and thought content.   Assessment   32y.o. [id number]  05/17/2022, by Last Menstrual Period presenting for routine prenatal visit  Plan   G4 Problems (from 02/11/22 to present)     Preterm labor symptoms and general obstetric precautions including but not limited to vaginal bleeding, contractions, leaking of fluid and fetal movement were reviewed in detail with the patient. Please refer to After Visit Summary for other counseling recommendations.   Return in about 1 week (around 04/25/2022) for rob with MD to sign VBAC consent.  JaRod CanCNM 04/18/2022 3:07 PM

## 2022-04-20 ENCOUNTER — Other Ambulatory Visit: Payer: Self-pay | Admitting: Advanced Practice Midwife

## 2022-04-20 DIAGNOSIS — B009 Herpesviral infection, unspecified: Secondary | ICD-10-CM

## 2022-04-20 LAB — CERVICOVAGINAL ANCILLARY ONLY
Chlamydia: POSITIVE — AB
Comment: NEGATIVE
Comment: NORMAL
Neisseria Gonorrhea: NEGATIVE

## 2022-04-20 LAB — STREP GP B NAA: Strep Gp B NAA: POSITIVE — AB

## 2022-04-20 MED ORDER — VALACYCLOVIR HCL 500 MG PO TABS: 500.0000 mg | ORAL_TABLET | Freq: Two times a day (BID) | ORAL | 6 refills | Status: DC

## 2022-04-20 NOTE — Progress Notes (Signed)
Rx valtrex prophylaxis sent

## 2022-04-24 ENCOUNTER — Encounter: Payer: Commercial Managed Care - HMO | Admitting: Licensed Practical Nurse

## 2022-04-24 ENCOUNTER — Telehealth: Payer: Self-pay | Admitting: Licensed Practical Nurse

## 2022-04-24 NOTE — Telephone Encounter (Signed)
Reached out to pt to reschedule ROB appt that was scheduled on 04/24/2022 at 3:15 with LMD.  Call could not completed.

## 2022-04-25 ENCOUNTER — Encounter: Payer: Self-pay | Admitting: Licensed Practical Nurse

## 2022-04-25 NOTE — Telephone Encounter (Signed)
Reached out to pt (2x) to reschedule ROB appt that was scheduled on 04/24/2022 at 3:15 with LMD. Call could not be completed.  Will send a MyChart letter.

## 2022-04-28 ENCOUNTER — Encounter: Payer: Self-pay | Admitting: Advanced Practice Midwife

## 2022-04-28 ENCOUNTER — Other Ambulatory Visit: Payer: Self-pay | Admitting: Advanced Practice Midwife

## 2022-04-28 DIAGNOSIS — A749 Chlamydial infection, unspecified: Secondary | ICD-10-CM

## 2022-04-28 MED ORDER — AZITHROMYCIN 500 MG PO TABS: 1000.0000 mg | ORAL_TABLET | Freq: Once | ORAL | 0 refills | Status: AC

## 2022-04-28 NOTE — Progress Notes (Signed)
Rx azithromycin sent to treat chlamydia. MyChart message sent. Spoke with patient's sister- alternative contact. She will try to get in contact and let Kalianne know to call the office.

## 2022-05-01 ENCOUNTER — Encounter: Payer: Self-pay | Admitting: Obstetrics and Gynecology

## 2022-05-01 ENCOUNTER — Other Ambulatory Visit: Payer: Self-pay

## 2022-05-01 ENCOUNTER — Emergency Department
Admission: EM | Admit: 2022-05-01 | Discharge: 2022-05-01 | Disposition: A | Discharge status: Home / Self Care | Payer: Medicaid Other | Attending: Emergency Medicine | Admitting: Emergency Medicine

## 2022-05-01 ENCOUNTER — Emergency Department: Payer: Medicaid Other

## 2022-05-01 ENCOUNTER — Inpatient Hospital Stay
Admission: EM | Admit: 2022-05-01 | Discharge: 2022-05-01 | Disposition: A | Discharge status: Home / Self Care | Payer: Medicaid Other | Source: Home / Self Care | Admitting: Obstetrics and Gynecology

## 2022-05-01 DIAGNOSIS — S8011XA Contusion of right lower leg, initial encounter: Secondary | ICD-10-CM | POA: Insufficient documentation

## 2022-05-01 DIAGNOSIS — S8012XA Contusion of left lower leg, initial encounter: Secondary | ICD-10-CM | POA: Insufficient documentation

## 2022-05-01 DIAGNOSIS — Z3A37 37 weeks gestation of pregnancy: Secondary | ICD-10-CM | POA: Insufficient documentation

## 2022-05-01 DIAGNOSIS — S40022A Contusion of left upper arm, initial encounter: Secondary | ICD-10-CM | POA: Insufficient documentation

## 2022-05-01 DIAGNOSIS — S065XAA Traumatic subdural hemorrhage with loss of consciousness status unknown, initial encounter: Secondary | ICD-10-CM | POA: Insufficient documentation

## 2022-05-01 DIAGNOSIS — O26893 Other specified pregnancy related conditions, third trimester: Secondary | ICD-10-CM | POA: Insufficient documentation

## 2022-05-01 DIAGNOSIS — S40021A Contusion of right upper arm, initial encounter: Secondary | ICD-10-CM | POA: Insufficient documentation

## 2022-05-01 DIAGNOSIS — O0970 Supervision of high risk pregnancy due to social problems, unspecified trimester: Secondary | ICD-10-CM

## 2022-05-01 DIAGNOSIS — R519 Headache, unspecified: Secondary | ICD-10-CM | POA: Insufficient documentation

## 2022-05-01 DIAGNOSIS — S0990XA Unspecified injury of head, initial encounter: Secondary | ICD-10-CM | POA: Diagnosis present

## 2022-05-01 MED ORDER — ACETAMINOPHEN 500 MG PO TABS
1000.0000 mg | ORAL_TABLET | Freq: Once | ORAL | Status: AC
  Administered 2022-05-01: 1000 mg via ORAL
  Filled 2022-05-01: qty 2

## 2022-05-01 MED ORDER — OXYCODONE HCL 5 MG PO TABS
5.0000 mg | ORAL_TABLET | Freq: Once | ORAL | Status: AC
  Administered 2022-05-01: 5 mg via ORAL
  Filled 2022-05-01: qty 1

## 2022-05-01 MED ORDER — BUTALBITAL-APAP-CAFFEINE 50-325-40 MG PO TABS: 1.0000 | ORAL_TABLET | Freq: Four times a day (QID) | ORAL | 0 refills | Status: AC | PRN
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
## 2022-05-01 NOTE — Telephone Encounter (Signed)
The patient's sister is calling to let us know Jerene Pitch is calling to let us know this patient has been admitted to ER for poss pre-term labor. The patient's sister is not listed on DPR.The patient doesn't have a current DPR on file. The patient's sister is aware we are unable to give out information. Brooke did want to let providers know she was homeless but currently residing with patient's is sister Jerene Pitch. There was no update on patient's contact phone number.
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
## 2022-05-01 NOTE — ED Notes (Signed)
Pt at CT
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
## 2022-05-01 NOTE — ED Notes (Signed)
US at bedside
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
## 2022-05-01 NOTE — OB Triage Note (Signed)
Pt. Discharged to ED- room 15 per CNM for further evaluation. Pt transported by RN and stable at time of transfer.

## 2022-05-02 DIAGNOSIS — S065XAA Traumatic subdural hemorrhage with loss of consciousness status unknown, initial encounter: Secondary | ICD-10-CM

## 2022-05-09 ENCOUNTER — Encounter: Payer: Self-pay | Admitting: Licensed Practical Nurse

## 2022-05-09 ENCOUNTER — Ambulatory Visit (INDEPENDENT_AMBULATORY_CARE_PROVIDER_SITE_OTHER): Payer: Medicaid Other | Admitting: Licensed Practical Nurse

## 2022-05-09 VITALS — BP 129/84 | HR 95 | Wt 166.0 lb

## 2022-05-09 DIAGNOSIS — O0993 Supervision of high risk pregnancy, unspecified, third trimester: Secondary | ICD-10-CM

## 2022-05-09 DIAGNOSIS — Z8659 Personal history of other mental and behavioral disorders: Secondary | ICD-10-CM

## 2022-05-09 DIAGNOSIS — O480 Post-term pregnancy: Secondary | ICD-10-CM

## 2022-05-09 DIAGNOSIS — B009 Herpesviral infection, unspecified: Secondary | ICD-10-CM

## 2022-05-09 DIAGNOSIS — O98313 Other infections with a predominantly sexual mode of transmission complicating pregnancy, third trimester: Secondary | ICD-10-CM

## 2022-05-09 DIAGNOSIS — Z3A38 38 weeks gestation of pregnancy: Secondary | ICD-10-CM

## 2022-05-09 DIAGNOSIS — A749 Chlamydial infection, unspecified: Secondary | ICD-10-CM

## 2022-05-09 DIAGNOSIS — O98813 Other maternal infectious and parasitic diseases complicating pregnancy, third trimester: Secondary | ICD-10-CM

## 2022-05-09 MED ORDER — VALACYCLOVIR HCL 500 MG PO TABS: 500.0000 mg | ORAL_TABLET | Freq: Two times a day (BID) | ORAL | 6 refills | Status: DC

## 2022-05-09 MED ORDER — SERTRALINE HCL 50 MG PO TABS: ORAL_TABLET | ORAL | 6 refills | Status: DC

## 2022-05-09 MED ORDER — AZITHROMYCIN 500 MG PO TABS: 1000.0000 mg | ORAL_TABLET | Freq: Once | ORAL | 0 refills | Status: AC
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
## 2022-05-16 NOTE — Addendum Note (Signed)
Addended by: Meryl Dare on: 05/16/2022 11:47 AM   Modules accepted: Orders

## 2022-05-17 ENCOUNTER — Ambulatory Visit (INDEPENDENT_AMBULATORY_CARE_PROVIDER_SITE_OTHER): Payer: Medicaid Other | Admitting: Obstetrics and Gynecology

## 2022-05-17 ENCOUNTER — Encounter: Payer: Self-pay | Admitting: Obstetrics and Gynecology

## 2022-05-17 VITALS — BP 142/96 | HR 109 | Wt 172.3 lb

## 2022-05-17 DIAGNOSIS — O480 Post-term pregnancy: Secondary | ICD-10-CM

## 2022-05-17 DIAGNOSIS — Z3A40 40 weeks gestation of pregnancy: Secondary | ICD-10-CM

## 2022-05-17 DIAGNOSIS — O0993 Supervision of high risk pregnancy, unspecified, third trimester: Secondary | ICD-10-CM

## 2022-05-17 LAB — POCT URINALYSIS DIPSTICK OB
Bilirubin, UA: NEGATIVE
Blood, UA: NEGATIVE
Glucose, UA: NEGATIVE
Ketones, UA: NEGATIVE
Leukocytes, UA: NEGATIVE
Nitrite, UA: NEGATIVE
POC,PROTEIN,UA: NEGATIVE
Spec Grav, UA: 1.015 (ref 1.010–1.025)
Urobilinogen, UA: 0.2 E.U./dL
pH, UA: 5 (ref 5.0–8.0)

## 2022-05-17 NOTE — Progress Notes (Signed)
ROB. Patient states daily fetal movement along with pelvic pressure. She would like a cervical check today. Patient states no questions or concerns at this time.

## 2022-05-17 NOTE — Progress Notes (Signed)
ROB: Patient reports she is doing well.  Denies contractions.  Reports daily fetal movement.  Blood pressure slightly elevated to growth-will recheck on Friday with MFM appointment and BPP.  Induction scheduled for 3/27 5 AM.  Discussed this with her in detail.  Patient desired membrane sweeping today.  1 cm / 20% moderately firm

## 2022-05-19 ENCOUNTER — Other Ambulatory Visit: Payer: Self-pay | Admitting: Licensed Practical Nurse

## 2022-05-19 ENCOUNTER — Ambulatory Visit
Admission: RE | Admit: 2022-05-19 | Discharge: 2022-05-19 | Disposition: A | Discharge status: Home / Self Care | Payer: Medicaid Other | Source: Ambulatory Visit | Attending: Licensed Practical Nurse | Admitting: Licensed Practical Nurse

## 2022-05-19 DIAGNOSIS — O480 Post-term pregnancy: Secondary | ICD-10-CM | POA: Insufficient documentation

## 2022-05-23 ENCOUNTER — Inpatient Hospital Stay
Admission: EM | Admit: 2022-05-23 | Discharge: 2022-05-25 | DRG: 806 | Disposition: A | Discharge status: Home / Self Care | Payer: Medicaid Other | Attending: Obstetrics and Gynecology | Admitting: Obstetrics and Gynecology

## 2022-05-23 ENCOUNTER — Other Ambulatory Visit: Payer: Self-pay

## 2022-05-23 DIAGNOSIS — Z59811 Housing instability, housed, with risk of homelessness: Secondary | ICD-10-CM | POA: Diagnosis not present

## 2022-05-23 DIAGNOSIS — O34219 Maternal care for unspecified type scar from previous cesarean delivery: Secondary | ICD-10-CM | POA: Diagnosis present

## 2022-05-23 DIAGNOSIS — Z3A40 40 weeks gestation of pregnancy: Secondary | ICD-10-CM | POA: Diagnosis not present

## 2022-05-23 DIAGNOSIS — O99820 Streptococcus B carrier state complicating pregnancy: Secondary | ICD-10-CM

## 2022-05-23 DIAGNOSIS — Z3490 Encounter for supervision of normal pregnancy, unspecified, unspecified trimester: Principal | ICD-10-CM

## 2022-05-23 DIAGNOSIS — O480 Post-term pregnancy: Secondary | ICD-10-CM | POA: Diagnosis present

## 2022-05-23 DIAGNOSIS — O0933 Supervision of pregnancy with insufficient antenatal care, third trimester: Secondary | ICD-10-CM

## 2022-05-23 DIAGNOSIS — Z8614 Personal history of Methicillin resistant Staphylococcus aureus infection: Secondary | ICD-10-CM | POA: Diagnosis not present

## 2022-05-23 DIAGNOSIS — Z5982 Transportation insecurity: Secondary | ICD-10-CM | POA: Diagnosis not present

## 2022-05-23 DIAGNOSIS — Z87891 Personal history of nicotine dependence: Secondary | ICD-10-CM | POA: Diagnosis not present

## 2022-05-23 DIAGNOSIS — O9832 Other infections with a predominantly sexual mode of transmission complicating childbirth: Secondary | ICD-10-CM | POA: Diagnosis present

## 2022-05-23 DIAGNOSIS — A6000 Herpesviral infection of urogenital system, unspecified: Secondary | ICD-10-CM | POA: Diagnosis present

## 2022-05-23 DIAGNOSIS — O99824 Streptococcus B carrier state complicating childbirth: Secondary | ICD-10-CM | POA: Diagnosis present

## 2022-05-23 LAB — CBC WITH DIFFERENTIAL/PLATELET
Abs Immature Granulocytes: 0.05 10*3/uL (ref 0.00–0.07)
Basophils Absolute: 0 10*3/uL (ref 0.0–0.1)
Basophils Relative: 0 %
Eosinophils Absolute: 0 10*3/uL (ref 0.0–0.5)
Eosinophils Relative: 0 %
HCT: 34 % — ABNORMAL LOW (ref 36.0–46.0)
Hemoglobin: 11 g/dL — ABNORMAL LOW (ref 12.0–15.0)
Immature Granulocytes: 0 %
Lymphocytes Relative: 16 %
Lymphs Abs: 2.2 10*3/uL (ref 0.7–4.0)
MCH: 29.3 pg (ref 26.0–34.0)
MCHC: 32.4 g/dL (ref 30.0–36.0)
MCV: 90.7 fL (ref 80.0–100.0)
Monocytes Absolute: 0.6 10*3/uL (ref 0.1–1.0)
Monocytes Relative: 5 %
Neutro Abs: 10.3 10*3/uL — ABNORMAL HIGH (ref 1.7–7.7)
Neutrophils Relative %: 79 %
Platelets: 251 10*3/uL (ref 150–400)
RBC: 3.75 MIL/uL — ABNORMAL LOW (ref 3.87–5.11)
RDW: 15.9 % — ABNORMAL HIGH (ref 11.5–15.5)
WBC: 13.2 10*3/uL — ABNORMAL HIGH (ref 4.0–10.5)
nRBC: 0 % (ref 0.0–0.2)

## 2022-05-23 LAB — URINE DRUG SCREEN, QUALITATIVE (ARMC ONLY)
Amphetamines, Ur Screen: NOT DETECTED
Barbiturates, Ur Screen: NOT DETECTED
Benzodiazepine, Ur Scrn: NOT DETECTED
Cannabinoid 50 Ng, Ur ~~LOC~~: NOT DETECTED
Cocaine Metabolite,Ur ~~LOC~~: NOT DETECTED
MDMA (Ecstasy)Ur Screen: NOT DETECTED
Methadone Scn, Ur: NOT DETECTED
Opiate, Ur Screen: NOT DETECTED
Phencyclidine (PCP) Ur S: NOT DETECTED
Tricyclic, Ur Screen: NOT DETECTED

## 2022-05-23 LAB — CHLAMYDIA/NGC RT PCR (ARMC ONLY)
Chlamydia Tr: NOT DETECTED
N gonorrhoeae: NOT DETECTED

## 2022-05-23 LAB — TYPE AND SCREEN
ABO/RH(D): A POS
Antibody Screen: NEGATIVE

## 2022-05-23 MED ORDER — OXYTOCIN 10 UNIT/ML IJ SOLN
10.0000 [IU] | Freq: Once | INTRAMUSCULAR | Status: AC
  Administered 2022-05-23: 10 [IU] via INTRAMUSCULAR

## 2022-05-23 MED ORDER — OXYTOCIN-SODIUM CHLORIDE 30-0.9 UT/500ML-% IV SOLN: 2.5000 [IU]/h | INTRAVENOUS | Status: DC

## 2022-05-23 MED ORDER — DIPHENHYDRAMINE HCL 25 MG PO CAPS: 25.0000 mg | ORAL_CAPSULE | Freq: Four times a day (QID) | ORAL | Status: DC | PRN

## 2022-05-23 MED ORDER — OXYTOCIN BOLUS FROM INFUSION: 333.0000 mL | Freq: Once | INTRAVENOUS | Status: DC

## 2022-05-23 MED ORDER — IBUPROFEN 600 MG PO TABS
ORAL_TABLET | ORAL | Status: AC
  Administered 2022-05-23: 600 mg via ORAL
  Filled 2022-05-23: qty 1

## 2022-05-23 MED ORDER — SODIUM CHLORIDE 0.9 % IV SOLN
2.0000 g | Freq: Once | INTRAVENOUS | Status: DC
  Filled 2022-05-23: qty 2000

## 2022-05-23 MED ORDER — LACTATED RINGERS IV SOLN: INTRAVENOUS | Status: DC

## 2022-05-23 MED ORDER — PRENATAL MULTIVITAMIN CH
1.0000 | ORAL_TABLET | Freq: Every day | ORAL | Status: DC
  Administered 2022-05-23 – 2022-05-24 (×2): 1 via ORAL
  Filled 2022-05-23 (×2): qty 1

## 2022-05-23 MED ORDER — IBUPROFEN 600 MG PO TABS
600.0000 mg | ORAL_TABLET | Freq: Four times a day (QID) | ORAL | Status: DC
  Administered 2022-05-23 – 2022-05-25 (×7): 600 mg via ORAL
  Filled 2022-05-23 (×8): qty 1

## 2022-05-23 MED ORDER — ONDANSETRON HCL 4 MG/2ML IJ SOLN: 4.0000 mg | Freq: Four times a day (QID) | INTRAMUSCULAR | Status: DC | PRN

## 2022-05-23 MED ORDER — SODIUM CHLORIDE 0.9 % IV SOLN
1.0000 g | INTRAVENOUS | Status: DC
  Filled 2022-05-23 (×2): qty 1000

## 2022-05-23 MED ORDER — ONDANSETRON HCL 4 MG/2ML IJ SOLN: 4.0000 mg | INTRAMUSCULAR | Status: DC | PRN

## 2022-05-23 MED ORDER — ZOLPIDEM TARTRATE 5 MG PO TABS: 5.0000 mg | ORAL_TABLET | Freq: Every evening | ORAL | Status: DC | PRN

## 2022-05-23 MED ORDER — TETANUS-DIPHTH-ACELL PERTUSSIS 5-2.5-18.5 LF-MCG/0.5 IM SUSY: 0.5000 mL | PREFILLED_SYRINGE | Freq: Once | INTRAMUSCULAR | Status: DC

## 2022-05-23 MED ORDER — ACETAMINOPHEN 325 MG PO TABS: 650.0000 mg | ORAL_TABLET | ORAL | Status: DC | PRN

## 2022-05-23 MED ORDER — ACETAMINOPHEN 325 MG PO TABS
650.0000 mg | ORAL_TABLET | ORAL | Status: DC | PRN
  Administered 2022-05-23: 650 mg via ORAL
  Filled 2022-05-23: qty 2

## 2022-05-23 MED ORDER — WITCH HAZEL-GLYCERIN EX PADS: 1.0000 | MEDICATED_PAD | CUTANEOUS | Status: DC | PRN

## 2022-05-23 MED ORDER — COCONUT OIL OIL: 1.0000 | TOPICAL_OIL | Status: DC | PRN

## 2022-05-23 MED ORDER — ONDANSETRON HCL 4 MG PO TABS: 4.0000 mg | ORAL_TABLET | ORAL | Status: DC | PRN

## 2022-05-23 MED ORDER — SIMETHICONE 80 MG PO CHEW: 80.0000 mg | CHEWABLE_TABLET | ORAL | Status: DC | PRN

## 2022-05-23 MED ORDER — SOD CITRATE-CITRIC ACID 500-334 MG/5ML PO SOLN: 30.0000 mL | ORAL | Status: DC | PRN

## 2022-05-23 MED ORDER — LACTATED RINGERS IV SOLN: 500.0000 mL | INTRAVENOUS | Status: DC | PRN

## 2022-05-23 MED ORDER — SENNOSIDES-DOCUSATE SODIUM 8.6-50 MG PO TABS
2.0000 | ORAL_TABLET | Freq: Every day | ORAL | Status: DC
  Administered 2022-05-24 – 2022-05-25 (×2): 2 via ORAL
  Filled 2022-05-23 (×2): qty 2

## 2022-05-23 MED ORDER — OXYCODONE-ACETAMINOPHEN 5-325 MG PO TABS: 2.0000 | ORAL_TABLET | ORAL | Status: DC | PRN

## 2022-05-23 MED ORDER — LIDOCAINE HCL (PF) 1 % IJ SOLN
30.0000 mL | INTRAMUSCULAR | Status: DC | PRN
  Filled 2022-05-23: qty 30

## 2022-05-23 MED ORDER — DIBUCAINE (PERIANAL) 1 % EX OINT: 1.0000 | TOPICAL_OINTMENT | CUTANEOUS | Status: DC | PRN

## 2022-05-23 MED ORDER — BENZOCAINE-MENTHOL 20-0.5 % EX AERO
1.0000 | INHALATION_SPRAY | CUTANEOUS | Status: DC | PRN
  Administered 2022-05-23: 1 via TOPICAL
  Filled 2022-05-23 (×2): qty 56

## 2022-05-23 MED ORDER — OXYCODONE-ACETAMINOPHEN 5-325 MG PO TABS: 1.0000 | ORAL_TABLET | ORAL | Status: DC | PRN

## 2022-05-23 NOTE — ED Provider Notes (Signed)
Patient presents in active labor. G4 [redacted]weeks pregnant. OB/neonatal team at bedside delivering baby. Baby delivered and taken to Mother/Baby unit. Patient to follow.   Paulette Blanch, MD 05/23/22 [id number]
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
## 2022-05-23 NOTE — Discharge Summary (Signed)
OB Discharge Summary     Patient Name: Cynthia Coffey DOB: 1990-03-29 MRN: [id number]  Date of admission: 05/23/2022 Delivering provider: Maggie Font, CNM  Date of Delivery: 05/23/2022  Date of discharge: 05/25/2022  Admitting diagnosis: Pregnancy [Z34.90] [redacted] weeks gestation of pregnancy [Z3A.40] Post term pregnancy [O48.0] Intrauterine pregnancy: [redacted]w[redacted]d     Secondary diagnosis: None     Discharge diagnosis: Term Pregnancy Delivered and VBAC                                                                                                Post partum procedures: none  Augmentation: N/A  Complications: None  Hospital course:  Onset of Labor With Vaginal Delivery      32 y.o. yo [id number] at [redacted]w[redacted]d was admitted in Active Labor on 05/23/2022. Labor course was complicated by precipitous delivery, shoulder dystocia  Membrane Rupture Time/Date:  ,05/23/2022   Delivery Method:Vaginal, Spontaneous  Episiotomy: None  Lacerations:  None  See delivery note for details  Patient had a postpartum course complicated by NA.  She is ambulating, tolerating a regular diet, passing flatus, and urinating well. Needs are met per TOC.   Patient is discharged home in stable condition on 05/25/22.  Newborn Data: Birth date:05/23/2022  Birth time:6:47 AM  Gender:Female  Living status:Living  Apgars:2 ,8  Weight:3150 g   Subjective:   Physical exam  Vitals:   05/24/22 1132 05/24/22 1521 05/24/22 2355 05/25/22 0838  BP: (!) 141/89 125/81 127/84 (!) 138/93  Pulse: 96 79 84 90  Resp: 16 18 18 18   Temp: 98.7 F (37.1 C) 98.2 F (36.8 C) 97.8 F (36.6 C) 98 F (36.7 C)  TempSrc: Oral Oral Oral Oral  SpO2: 100%  99% 98%  Weight:      Height:       General: alert, cooperative, and no distress Breast: soft, non-tender, nipples without breakdown Lochia: appropriate Uterine Fundus: firm Perineum: no erythema or foul odor discharge, minimal edema DVT Evaluation: No evidence of DVT seen  on physical exam.  Labs: Lab Results  Component Value Date   WBC 10.9 (H) 05/24/2022   HGB 9.9 (L) 05/24/2022   HCT 30.9 (L) 05/24/2022   MCV 91.4 05/24/2022   PLT 232 05/24/2022    Discharge instruction: in After Visit Summary.  Medications:  Allergies as of 05/25/2022   No Known Allergies      Medication List     STOP taking these medications    valACYclovir 500 MG tablet Commonly known as: VALTREX       TAKE these medications    acetaminophen 500 MG tablet Commonly known as: TYLENOL Take 500 mg by mouth every 6 (six) hours as needed.   multivitamin-prenatal 27-0.8 MG Tabs tablet Take 1 tablet by mouth daily at 12 noon.   sertraline 50 MG tablet Commonly known as: ZOLOFT Take 1 tablet (50 mg total) by mouth daily. What changed:  how much to take how to take this when to take this additional instructions         Activity: Advance as tolerated. Pelvic rest for  6 weeks.   Outpatient follow up:  Follow-up [facility] at [facility] Follow up.   Specialty: Obstetrics and Gynecology Why: call to set up a two week telehealth visit and six week in person postpartum visit. Mirena insertion Contact information: [address] [place] [phone]                  Postpartum contraception: IUD Rhogam Given postpartum: not indicated Rubella vaccine given postpartum: not indicated Varicella vaccine given postpartum: not indicated TDaP given antepartum or postpartum: to be offered   Newborn Delivery   Birth date/time: 05/23/2022 06:47:00 Delivery type: Vaginal, Spontaneous       Baby Feeding: Formula  Disposition:home with mother   Christean Leaf, [facility] 05/25/2022, 1:46 PM

## 2022-05-23 NOTE — ED Triage Notes (Signed)
Pt presents to ER via ems from home in active labor.  Pt is 41 weeks, G4.  On arrival, pt screaming and having urge to push.  ED MD's Beather Arbour and Ward at bedside to prepare for imminent delivery.    Pt had spontaneous delivery at 0647.

## 2022-05-23 NOTE — Progress Notes (Signed)
Received call from ED that EMS was en route with a patient in active labor. No ETA or further information given. Charge RN alerted [facility] & transition. L&D charge RN, SCC, and SCN nurse arrived to ED and were directed to room 2. Requested neonatal resuscitation equipment and warmer to bedside. Patient arrived and was found to be complete with an anterior lip, plus 1 station. Heavy meconium noted on exam, and Cynthia Coffey was involuntarily pushing. Called Maggie Font, CNM for imminent delivery. ED MDs Beather Arbour and Ward at bedside. Attempted to auscultate FHT, pt refused. IV access lost d/t vigorous maternal movement. Fetal head delivered. Loose nuchal cord reduced, followed by an approximately 40 second shoulder dystocia. Pt was moving vigorously with several ED staff attempting to keep her safely on the bed. L&D RN instructed ED staff to assist with McRoberts maneuver and [place] Jetta Murray L&D RN gently guided the shoulders out, followed by the body. SCC & SCN RN assisted with stimulating baby. Cord clamped and cut after 1 minute for baby to be attended to by NICU staff. IM pitocin administered by L&D RN. CNM delivered placenta and pt was moved to [facility] for recovery.
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
## 2022-05-24 LAB — CBC
HCT: 30.9 % — ABNORMAL LOW (ref 36.0–46.0)
Hemoglobin: 9.9 g/dL — ABNORMAL LOW (ref 12.0–15.0)
MCH: 29.3 pg (ref 26.0–34.0)
MCHC: 32 g/dL (ref 30.0–36.0)
MCV: 91.4 fL (ref 80.0–100.0)
Platelets: 232 10*3/uL (ref 150–400)
RBC: 3.38 MIL/uL — ABNORMAL LOW (ref 3.87–5.11)
RDW: 15.8 % — ABNORMAL HIGH (ref 11.5–15.5)
WBC: 10.9 10*3/uL — ABNORMAL HIGH (ref 4.0–10.5)
nRBC: 0 % (ref 0.0–0.2)

## 2022-05-24 LAB — RPR: RPR Ser Ql: NONREACTIVE

## 2022-05-24 MED ORDER — SERTRALINE HCL 25 MG PO TABS
50.0000 mg | ORAL_TABLET | Freq: Every day | ORAL | Status: DC
  Administered 2022-05-24: 50 mg via ORAL
  Filled 2022-05-24: qty 2
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
## 2022-05-24 NOTE — Progress Notes (Signed)
Post Partum Day 1 Subjective: no complaints, up ad lib, voiding, and tolerating PO  Objective: Blood pressure (!) 141/89, pulse 96, temperature 98.7 F (37.1 C), temperature source Oral, resp. rate 16, height 5\' 4"  (1.626 m), weight 78.9 kg, last menstrual period 08/10/2021, SpO2 100 %.  Physical Exam:  General: alert, cooperative, and no distress Lochia: appropriate Uterine Fundus: firm Incision: intact perineum DVT Evaluation: No evidence of DVT seen on physical exam. Negative Homan's sign.  Recent Labs    05/23/22 0904 05/24/22 0522  HGB 11.0* 9.9*  HCT 34.0* 30.9*    Assessment/Plan: Plan for discharge tomorrow, Social Work consult, and Contraception needs some counsel on birth control prior to discharge. She is encouraged to call the AOB office and set up her two week virtual visit and the 6 wk PP visit. She will need a pap smear at the 6 wk visit.   LOS: 1 day   Imagene Riches, CNM 05/24/2022, 11:56 AM

## 2022-05-25 MED ORDER — SERTRALINE HCL 50 MG PO TABS: 50.0000 mg | ORAL_TABLET | Freq: Every day | ORAL | 11 refills | Status: AC

## 2022-05-25 NOTE — Progress Notes (Signed)
Patient discharged home with family.  Discharge instructions, when to follow up, and prescriptions reviewed with patient.  Patient verbalized understanding. Patient will be escorted out by auxiliary.   

## 2022-05-25 NOTE — Discharge Instructions (Signed)
Discharge instructions:   Call office if you have any of the following:  headache, visual changes, fever >101.0 F, chills, breast concerns (engorgement, mastitis) excessive vaginal bleeding, incision drainage or problems, leg pain or redness, depression or any other concerns.   Activity: Do not lift > 10 lbs for 6 weeks.  No intercourse or tampons for 6 weeks.  No driving for 1-2 weeks or while taking pain medication. No strenuous activity or heavy lifting for 6 weeks.  No swimming pools, hot tubs or tub baths- showers only.    It is normal to bleed for up to 6 weeks. You should not soak through more than 1 pad in 1 hour.   Continue prenatal vitamin. Increase calories and fluids while breastfeeding.   For concerns about your baby, please call your pediatrician   Postpartum blues (feelings of happy one minute and sad another minute) are normal for the first few weeks but if it gets worse let your doctor know.   

## 2022-06-07 ENCOUNTER — Telehealth: Payer: Self-pay | Admitting: Obstetrics and Gynecology

## 2022-06-07 NOTE — Telephone Encounter (Signed)
I contacted the patient via phone. I left message for the patient to call back. I was going to offer a nurse visit for Blood pressure check due to no opening in the scheduled for this week.

## 2022-06-07 NOTE — Telephone Encounter (Signed)
I contacted the patient to scheduled 2 week PP/ BP check with Dr. Valentino Saxon at 4:15 pm today. The patient states she doesn't have transportation today. I advised we would need to scheduled for 6 week PP. The patient has scheduled appointment for 5/8 at 10:55 am with [facility]MD for 6 week PP follow up. There are no opening in the scheduled for next week to offer an appointment. I will contact the patient when a cancellation comes opened.

## 2022-06-08 ENCOUNTER — Ambulatory Visit: Payer: Medicaid Other | Admitting: Obstetrics

## 2022-06-08 NOTE — Telephone Encounter (Addendum)
I contacted this patient for this week follow up for 2 week PP and BP check with Paula Compton at 1:15 today, 4/11. I left message for the patient to call back to confirm this appointment.

## 2022-06-09 ENCOUNTER — Telehealth: Payer: Self-pay

## 2022-06-09 NOTE — Telephone Encounter (Signed)
PP nurse called twice without contact, left a voicemail the second attempt.

## 2022-06-09 NOTE — Telephone Encounter (Signed)
I contacted the patient via phone. Voicemail is full.

## 2022-06-13 NOTE — Telephone Encounter (Signed)
I contacted the patient via phone. I left voicemail for the patient to call back to confirm the scheduled appointment for 4/17 at 2:55 for Blood pressure check and mood check with Zadie Cleverly.

## 2022-06-14 ENCOUNTER — Ambulatory Visit: Payer: Medicaid Other | Admitting: Medical

## 2022-06-15 NOTE — Telephone Encounter (Signed)
I contacted the patient via phone. I advised patient to give the office a call back.

## 2022-06-21 ENCOUNTER — Encounter: Payer: Self-pay | Admitting: Licensed Practical Nurse

## 2022-06-21 NOTE — Telephone Encounter (Addendum)
Missed appointment letter has been mail to patient to call back to scheduled.

## 2022-06-22 ENCOUNTER — Telehealth: Payer: Self-pay | Admitting: Medical

## 2022-06-22 NOTE — Telephone Encounter (Signed)
Reached out to pt to reschedule PP visit (mood and BP check) that was scheduled with Vonzella Nipple on 06/14/2022 at 2:55.  Left message for pt to call back to reschedule.

## 2022-06-27 NOTE — Telephone Encounter (Signed)
I contacted the patient via phone. The patient confirmed scheduled appointment for 5/8 with LMD. I advised the patient we need to see her for BP check and the patient states between her child's appointment this week and court she wants to wait to be seen on 5/8 with [facility].

## 2022-07-05 ENCOUNTER — Encounter: Payer: Self-pay | Admitting: Licensed Practical Nurse

## 2022-07-05 ENCOUNTER — Ambulatory Visit (INDEPENDENT_AMBULATORY_CARE_PROVIDER_SITE_OTHER): Payer: Medicaid Other | Admitting: Licensed Practical Nurse

## 2022-07-05 DIAGNOSIS — Z30430 Encounter for insertion of intrauterine contraceptive device: Secondary | ICD-10-CM | POA: Diagnosis not present

## 2022-07-05 DIAGNOSIS — Z1332 Encounter for screening for maternal depression: Secondary | ICD-10-CM | POA: Diagnosis not present

## 2022-07-06 MED ORDER — LEVONORGESTREL 20 MCG/DAY IU IUD
1.0000 | INTRAUTERINE_SYSTEM | Freq: Once | INTRAUTERINE | Status: AC
  Administered 2022-07-12: 1 via INTRAUTERINE

## 2022-07-06 NOTE — Progress Notes (Signed)
Postpartum Visit  Chief Complaint:  Chief Complaint  Patient presents with   Postpartum Care    6 week PP    History of Present Illness: Patient is a 32 y.o. [name] presents for postpartum visit.  Date of delivery: VBAC, birthed in the ED, RN catch  Type of delivery: Vaginal delivery - Vacuum or forceps assisted  no Episiotomy No.  Laceration: no  Pregnancy or labor problems:  hx of substance abuse, unstable housing, hypertension  Any problems since the delivery:  no -Pt was unaware that she needed to return sooner for a BP check, and her grandfather has been ill-he is her source of transportation. Her grandpa has been checking her BP and it "has been fine".   -Doing well, is really enjoying her baby, feels the infant is bringing meaning to her life.  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 Baby's name: female. Birth weight: 3150grams Maternal Details:  Breast Feeding:  no Post partum depression/anxiety noted:  no Edinburgh Post-Partum Depression Score:  2  Date of last PAP: 01/2022  LSIL HPV positive    Past Medical History:  Diagnosis Date   Medical history non-contributory    Pregnancy induced hypertension     Past Surgical History:  Procedure Laterality Date   CESAREAN SECTION N/A 06/10/2017   Procedure: CESAREAN SECTION;  Surgeon: Vena Austria, MD;  Location: [facility] ORS;  Service: Obstetrics;  Laterality: N/A;    Prior to Admission medications   Medication Sig Start Date End Date Taking? Authorizing Provider  acetaminophen (TYLENOL) 500 MG tablet Take 500 mg by mouth every 6 (six) hours as needed.   Yes [provider]  sertraline  (ZOLOFT) 50 MG tablet Take 1 tablet (50 mg total) by mouth daily. 05/25/22  Yes Tresea Mall, CNM  Prenatal Vit-Fe Fumarate-FA (MULTIVITAMIN-PRENATAL) 27-0.8 MG TABS tablet Take 1 tablet by mouth daily at 12 noon. Patient not taking: Reported on 07/05/2022    [provider]    No Known Allergies   Social History   Socioeconomic History   Marital status: Single    Spouse name: Not on file   Number of children: Not on file   Years of education: Not on file   Highest education level: Not on file  Occupational History   Not on file  Tobacco Use   Smoking status: Former    Types: E-cigarettes   Smokeless tobacco: Never  Vaping Use   Vaping Use: Every day  Substance and Sexual Activity   Alcohol use: Not Currently   Drug use: Not Currently    Types: Marijuana    Comment: Meth   Sexual activity: Yes    Birth control/protection: Surgical    Comment: unsure  Other Topics Concern   Not on file  Social History Narrative   Not on file   Social Determinants of Health   Financial Resource Strain: Not on file  Food Insecurity: Food Insecurity Present (02/15/2022)   Hunger Vital Sign    Worried About Running Out of Food in the Last Year: Often true    Ran Out of Food in the Last Year: Often true  Transportation Needs: Unmet Transportation  Needs (02/15/2022)   PRAPARE - Administrator, Civil Service (Medical): Yes    Lack of Transportation (Non-Medical): Yes  Physical Activity: Not on file  Stress: Not on file  Social Connections: Not on file  Intimate Partner Violence: Not on file    Family History  Problem Relation Age of Onset   Diabetes Paternal Grandfather    Throat cancer Paternal Grandmother     ROS see HPI   Physical Exam BP 126/86   Pulse 93   Wt 159 lb 1.6 oz (72.2 kg)   LMP 08/10/2021 (Approximate)   Breastfeeding No   BMI 27.31 kg/m   Physical Exam Constitutional:      Appearance: Normal appearance.  Genitourinary:     Vulva  normal.     Genitourinary Comments: Spec: cervix pink, no lesions Bimanual: uterus non gravid, anteverted, no masses, non tender, adnexa non tender, no masses, not enlarged   Cardiovascular:     Rate and Rhythm: Normal rate and regular rhythm.     Pulses: Normal pulses.     Heart sounds: Normal heart sounds.  Pulmonary:     Breath sounds: Normal breath sounds.  Chest:     Comments: Breasts: soft, no redness or masses, nipples intact bilaterally  Abdominal:     General: Abdomen is flat.     Tenderness: There is no abdominal tenderness.  Musculoskeletal:        General: Normal range of motion.     Cervical back: Normal range of motion and neck supple.     Right lower leg: No edema.     Left lower leg: No edema.  Neurological:     General: No focal deficit present.     Mental Status: She is alert.  Skin:    General: Skin is warm.  Psychiatric:        Mood and Affect: Mood normal.      Female Chaperone present during breast and/or pelvic exam.    GYNECOLOGY OFFICE PROCEDURE NOTE  [name] is a 32 y.o. [phone] here for Mirena a IUD insertion. No GYN concerns.  Last pap smear was on 01/2022 and was abnormal.  The patient is currently using PP for contraception and her LMP is Patient's last menstrual period was 08/10/2021 (approximate)..  The indication for her IUD is contraception/cycle control.  IUD Insertion Procedure Note Patient identified, informed consent performed, consent signed.   Discussed risks of irregular bleeding, cramping, infection, malpositioning, expulsion or uterine perforation of the IUD (1:1000 placements)  which may require further procedure such as laparoscopy.  IUD while effective at preventing pregnancy do not prevent transmission of sexually transmitted diseases and use of barrier methods for this purpose was discussed. Time out was performed.  Urine pregnancy test negative.  Speculum placed in the vagina.  Cervix visualized.  Cleaned with  Betadine x 2.  Grasped anteriorly with a single tooth tenaculum.  Uterus sounded to 6 cm. IUD placed per manufacturer's recommendations.  Strings trimmed to 3 cm. Tenaculum was removed, good hemostasis noted.  Patient tolerated procedure well.   Patient was given post-procedure instructions.  She was advised to have backup contraception for one week.  Patient was also asked to check IUD strings periodically and follow up in 6 weeks for IUD check. Will have string check at time of colpo.    Carie Caddy, CNM  [facility]     Assessment: 32 y.o. [phone] presenting for 6 week postpartum visit  Plan:  Problem List Items Addressed This Visit       Other   Postpartum care following vaginal delivery - Primary   Other Visit Diagnoses     Encounter for IUD insertion       Encounter for screening for maternal depression            1) Contraception IUD placed, see note .  2)  Pap - ASCCP guidelines and rational discussed.  Patient will schedule colpo   3) Patient underwent screening for postpartum depression with no concerns noted.  4) Follow up 1 year for routine annual exam  Jannifer Hick   [facility]  07/06/22  2:33 PM

## 2022-07-19 ENCOUNTER — Other Ambulatory Visit: Payer: Self-pay

## 2022-08-02 NOTE — Progress Notes (Deleted)
    GYNECOLOGY OFFICE COLPOSCOPY PROCEDURE NOTE  32 y.o. [id number] here for colposcopy for low-grade squamous intraepithelial neoplasia (LGSIL - encompassing HPV,mild dysplasia,CIN I) pap smear on 02/14/2022. Discussed role for HPV in cervical dysplasia, need for surveillance.  Patient gave informed written consent, time out was performed.  Placed in lithotomy position. Cervix viewed with speculum and colposcope after application of acetic acid.   Colposcopy adequate? {yes/no:20286}  {Findings; colposcopy:728}; corresponding biopsies obtained.  ECC specimen obtained. All specimens were labeled and sent to pathology.  Chaperone was present during entire procedure.  Patient was given post procedure instructions.  Will follow up pathology and manage accordingly; patient will be contacted with results and recommendations.  Routine preventative health maintenance measures emphasized.    Hildred Laser, MD [facility]

## 2022-08-04 ENCOUNTER — Telehealth: Payer: Self-pay | Admitting: Obstetrics and Gynecology

## 2022-08-04 ENCOUNTER — Ambulatory Visit: Payer: Medicaid Other | Admitting: Obstetrics and Gynecology

## 2022-08-04 NOTE — Telephone Encounter (Signed)
Reached out to pt to reschedule colpo that was scheduled with Dr. Valentino Saxon on 08/04/2022 at 10:15.  Left message for pt to call back to reschedule.

## 2022-08-07 NOTE — Telephone Encounter (Signed)
Reached out to pt to reschedule colpo that was scheduled with Dr.Cherry on 08/04/2022 at 10:15.  Was able to reschedule for 09/13/22 at 3:15.

## 2022-09-13 ENCOUNTER — Other Ambulatory Visit (HOSPITAL_COMMUNITY)
Admission: RE | Admit: 2022-09-13 | Discharge: 2022-09-13 | Disposition: A | Discharge status: Home / Self Care | Payer: MEDICAID | Source: Ambulatory Visit | Attending: Obstetrics and Gynecology | Admitting: Obstetrics and Gynecology

## 2022-09-13 ENCOUNTER — Ambulatory Visit (INDEPENDENT_AMBULATORY_CARE_PROVIDER_SITE_OTHER): Payer: MEDICAID | Admitting: Obstetrics and Gynecology

## 2022-09-13 ENCOUNTER — Encounter: Payer: Self-pay | Admitting: Obstetrics and Gynecology

## 2022-09-13 VITALS — BP 115/68 | HR 96 | Resp 16 | Ht 64.0 in | Wt 164.9 lb

## 2022-09-13 DIAGNOSIS — N870 Mild cervical dysplasia: Secondary | ICD-10-CM | POA: Diagnosis not present

## 2022-09-13 DIAGNOSIS — R87810 Cervical high risk human papillomavirus (HPV) DNA test positive: Secondary | ICD-10-CM | POA: Diagnosis not present

## 2022-09-13 DIAGNOSIS — R87610 Atypical squamous cells of undetermined significance on cytologic smear of cervix (ASC-US): Secondary | ICD-10-CM | POA: Diagnosis present

## 2022-09-13 DIAGNOSIS — R102 Pelvic and perineal pain: Secondary | ICD-10-CM | POA: Insufficient documentation

## 2022-09-13 NOTE — Progress Notes (Signed)
    GYNECOLOGY OFFICE COLPOSCOPY PROCEDURE NOTE  32 y.o. 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 Discussed role for HPV in cervical dysplasia, need for surveillance.  Patient also desires to be screened for STDs (cervical swab), due to persistent bleeding and discharge with IUD. Has had IUD in place for ~ 2 months.   Patient gave informed written consent, time out was performed.  Placed in lithotomy position. Cervix viewed with speculum and colposcope after application of acetic acid.   Colposcopy adequate? Yes Acetowhite lesion(s) noted at 3 o'clock and HPV changes noted at 1 o'clock; corresponding biopsies obtained.  ECC specimen obtained.  IUD threads visible, ~ 2 cm in length.  All specimens were labeled and sent to pathology.  Chaperone was present during entire procedure.  Patient was given post procedure instructions.  Will follow up pathology and manage accordingly; patient will be contacted with results and recommendations.  Routine preventative health maintenance measures emphasized.  Will notify patient if STD screening positive. Advised on NSAIDs for bleeding with IUD. Advised that bleeding can be irregular for up to 3 months after insertion.     Hildred Laser, MD  [facility]

## 2022-09-13 NOTE — Patient Instructions (Signed)
Colposcopy, Care After  The following information offers guidance on how to care for yourself after your procedure. Your health care provider may also give you more specific instructions. If you have problems or questions, contact your health care provider. What can I expect after the procedure? If you had a colposcopy without a biopsy, you can expect to feel fine right away after your procedure. However, you may have some spotting of blood for a few days. You can return to your normal activities. If you had a colposcopy with a biopsy, it is common after the procedure to have: Soreness and mild pain. These may last for a few days. Mild vaginal bleeding or discharge that is dark-colored and grainy. This may last for a few days. The discharge may be caused by a liquid (solution) that was used during the procedure. You may need to wear a sanitary pad during this time. Spotting of blood for at least 48 hours after the procedure. Follow these instructions at home: Medicines Take over-the-counter and prescription medicines only as told by your health care provider. Talk with your health care provider about what type of over-the-counter pain medicines and prescription medicines you can start to take again. It is especially important to talk with your health care provider if you take blood thinners. Activity Avoid using douche products, using tampons, and having sex for at least 3 days after the procedure or for as long as told by your health care provider. Return to your normal activities as told by your health care provider. Ask your health care provider what activities are safe for you. General instructions Ask your health care provider if you may take baths, swim, or use a hot tub. You may take showers. If you use birth control (contraception), continue to use it. Keep all follow-up visits. This is important. Contact a health care provider if: You have a fever or chills. You faint or feel  light-headed. Get help right away if: You have heavy bleeding from your vagina or pass blood clots. Heavy bleeding is bleeding that soaks through a sanitary pad in less than 1 hour. You have vaginal discharge that is abnormal, is yellow in color, or smells bad. This could be a sign of infection. You have severe pain or cramps in your lower abdomen that do not go away with medicine. Summary If you had a colposcopy without a biopsy, you can expect to feel fine right away, but you may have some spotting of blood for a few days. You can return to your normal activities. If you had a colposcopy with a biopsy, it is common to have mild pain for a few days and spotting for 48 hours after the procedure. Avoid using douche products, using tampons, and having sex for at least 3 days after the procedure or for as long as told by your health care provider. Get help right away if you have heavy bleeding, severe pain, or signs of infection. This information is not intended to replace advice given to you by your health care provider. Make sure you discuss any questions you have with your health care provider. Document Revised: 07/11/2020 Document Reviewed: 07/11/2020 Elsevier Patient Education  2024 [employer]  

## 2022-09-15 LAB — SURGICAL PATHOLOGY

## 2022-09-15 LAB — CERVICOVAGINAL ANCILLARY ONLY
Bacterial Vaginitis (gardnerella): NEGATIVE
Candida Glabrata: NEGATIVE
Candida Vaginitis: NEGATIVE
Chlamydia: NEGATIVE
Comment: NEGATIVE
Comment: NEGATIVE
Comment: NEGATIVE
Comment: NEGATIVE
Comment: NEGATIVE
Comment: NORMAL
Neisseria Gonorrhea: NEGATIVE
Trichomonas: NEGATIVE

## 2022-09-26 ENCOUNTER — Telehealth: Payer: Self-pay

## 2022-09-26 NOTE — Telephone Encounter (Signed)
Pt calling for results from the 17th.  [phone] Pt aware of results.
